# Patient Record
Sex: Male | Born: 1945 | Race: White | Hispanic: No | Marital: Married | Smoking: Never smoker
Health system: Southern US, Community
[De-identification: ages and names within clinical notes are randomized; demographics above are authoritative.]

## PROBLEM LIST (undated history)

## (undated) DIAGNOSIS — M722 Plantar fascial fibromatosis: Secondary | ICD-10-CM

## (undated) DIAGNOSIS — E790 Hyperuricemia without signs of inflammatory arthritis and tophaceous disease: Secondary | ICD-10-CM

## (undated) DIAGNOSIS — K219 Gastro-esophageal reflux disease without esophagitis: Secondary | ICD-10-CM

## (undated) DIAGNOSIS — Z9289 Personal history of other medical treatment: Secondary | ICD-10-CM

## (undated) DIAGNOSIS — R55 Syncope and collapse: Secondary | ICD-10-CM

## (undated) DIAGNOSIS — M779 Enthesopathy, unspecified: Secondary | ICD-10-CM

## (undated) DIAGNOSIS — M719 Bursopathy, unspecified: Secondary | ICD-10-CM

## (undated) DIAGNOSIS — E785 Hyperlipidemia, unspecified: Secondary | ICD-10-CM

## (undated) DIAGNOSIS — G969 Disorder of central nervous system, unspecified: Secondary | ICD-10-CM

## (undated) DIAGNOSIS — M1990 Unspecified osteoarthritis, unspecified site: Secondary | ICD-10-CM

## (undated) DIAGNOSIS — J329 Chronic sinusitis, unspecified: Secondary | ICD-10-CM

## (undated) DIAGNOSIS — R0789 Other chest pain: Secondary | ICD-10-CM

## (undated) HISTORY — DX: Syncope and collapse: R55

## (undated) HISTORY — PX: HEMORRHOID SURGERY: SHX153

## (undated) HISTORY — DX: Bursopathy, unspecified: M71.9

## (undated) HISTORY — DX: Disorder of central nervous system, unspecified: G96.9

## (undated) HISTORY — DX: Enthesopathy, unspecified: M77.9

## (undated) HISTORY — DX: Other chest pain: R07.89

## (undated) HISTORY — DX: Hyperlipidemia, unspecified: E78.5

## (undated) HISTORY — DX: Personal history of other medical treatment: Z92.89

## (undated) HISTORY — DX: Unspecified osteoarthritis, unspecified site: M19.90

## (undated) HISTORY — PX: FRACTURE SURGERY: SHX138

## (undated) HISTORY — DX: Chronic sinusitis, unspecified: J32.9

## (undated) HISTORY — DX: Hyperuricemia without signs of inflammatory arthritis and tophaceous disease: E79.0

## (undated) HISTORY — DX: Plantar fascial fibromatosis: M72.2

---
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
## 1994-10-03 HISTORY — PX: CRANIOTOMY: SHX93

## 1997-10-03 HISTORY — PX: OTHER SURGICAL HISTORY: SHX169

## 2004-02-02 ENCOUNTER — Other Ambulatory Visit: Payer: Self-pay

## 2004-09-29 ENCOUNTER — Other Ambulatory Visit: Payer: Self-pay

## 2004-10-07 ENCOUNTER — Ambulatory Visit: Payer: Self-pay | Admitting: Surgery

## 2005-10-03 HISTORY — PX: CARDIAC CATHETERIZATION: SHX172

## 2006-03-08 ENCOUNTER — Ambulatory Visit: Payer: Self-pay | Admitting: Cardiology

## 2006-03-08 ENCOUNTER — Observation Stay (HOSPITAL_COMMUNITY): Admission: AD | Admit: 2006-03-08 | Discharge: 2006-03-10 | Payer: Self-pay | Admitting: Cardiology

## 2006-04-15 ENCOUNTER — Other Ambulatory Visit: Payer: Self-pay

## 2006-04-15 ENCOUNTER — Emergency Department: Payer: Self-pay | Admitting: Emergency Medicine

## 2009-02-05 ENCOUNTER — Observation Stay: Payer: Self-pay | Admitting: Specialist

## 2009-02-05 ENCOUNTER — Ambulatory Visit: Payer: Self-pay | Admitting: Cardiology

## 2009-02-10 ENCOUNTER — Ambulatory Visit: Payer: Self-pay | Admitting: Cardiovascular Disease

## 2009-02-19 ENCOUNTER — Ambulatory Visit: Payer: Self-pay | Admitting: Cardiology

## 2009-02-19 ENCOUNTER — Encounter: Payer: Self-pay | Admitting: Cardiology

## 2009-02-24 ENCOUNTER — Ambulatory Visit: Payer: Self-pay | Admitting: Cardiology

## 2009-03-04 LAB — CONVERTED CEMR LAB
ALT: 21 units/L (ref 0–53)
AST: 19 units/L (ref 0–37)
Albumin: 4.3 g/dL (ref 3.5–5.2)
Alkaline Phosphatase: 64 units/L (ref 39–117)
BUN: 17 mg/dL (ref 6–23)
CO2: 22 meq/L (ref 19–32)
Calcium: 9.4 mg/dL (ref 8.4–10.5)
Chloride: 107 meq/L (ref 96–112)
Cholesterol: 156 mg/dL (ref 0–200)
Creatinine, Ser: 1.11 mg/dL (ref 0.40–1.50)
Glucose, Bld: 98 mg/dL (ref 70–99)
HDL: 51 mg/dL (ref >39)
LDL Cholesterol: 86 mg/dL (ref 0–99)
Potassium: 4.1 meq/L (ref 3.5–5.3)
Sodium: 142 meq/L (ref 135–145)
Total Bilirubin: 0.9 mg/dL (ref 0.3–1.2)
Total CHOL/HDL Ratio: 3.1
Total Protein: 6.9 g/dL (ref 6.0–8.3)
Triglycerides: 94 mg/dL (ref <150)
VLDL: 19 mg/dL (ref 0–40)

## 2009-09-04 ENCOUNTER — Ambulatory Visit: Payer: Self-pay | Admitting: Cardiology

## 2009-09-04 DIAGNOSIS — E785 Hyperlipidemia, unspecified: Secondary | ICD-10-CM | POA: Insufficient documentation

## 2009-09-04 DIAGNOSIS — R079 Chest pain, unspecified: Secondary | ICD-10-CM | POA: Insufficient documentation

## 2009-10-22 ENCOUNTER — Ambulatory Visit: Payer: Self-pay | Admitting: Podiatry

## 2010-10-25 ENCOUNTER — Telehealth (INDEPENDENT_AMBULATORY_CARE_PROVIDER_SITE_OTHER): Payer: Self-pay | Admitting: *Deleted

## 2010-10-29 ENCOUNTER — Ambulatory Visit
Admission: RE | Admit: 2010-10-29 | Discharge: 2010-10-29 | Payer: Self-pay | Source: Home / Self Care | Attending: Cardiology | Admitting: Cardiology

## 2010-10-29 ENCOUNTER — Encounter: Payer: Self-pay | Admitting: Cardiology

## 2010-11-04 NOTE — Progress Notes (Signed)
Summary: Called pt  Phone Note Outgoing Call Call back at [facility] Phone [phone]   Call placed by: Harlon Flor,  October 25, 2010 2:18 PM Call placed to: Patient Summary of Call: [facility] TCB to reschedule appt with [facility] from 10/29/10 Initial call taken by: Harlon Flor,  October 25, 2010 2:18 PM

## 2010-11-05 ENCOUNTER — Encounter: Payer: Self-pay | Admitting: Cardiology

## 2010-11-05 ENCOUNTER — Other Ambulatory Visit (INDEPENDENT_AMBULATORY_CARE_PROVIDER_SITE_OTHER): Payer: BC Managed Care – PPO

## 2010-11-05 DIAGNOSIS — E785 Hyperlipidemia, unspecified: Secondary | ICD-10-CM

## 2010-11-09 LAB — CONVERTED CEMR LAB
ALT: 19 units/L (ref 0–53)
Bilirubin, Direct: 0.3 mg/dL (ref 0.0–0.3)
Cholesterol: 153 mg/dL (ref 0–200)
Indirect Bilirubin: 1.7 mg/dL — ABNORMAL HIGH (ref 0.0–0.9)
LDL Cholesterol: 91 mg/dL (ref 0–99)
Total Bilirubin: 2 mg/dL — ABNORMAL HIGH (ref 0.3–1.2)
Triglycerides: 95 mg/dL (ref <150)
VLDL: 19 mg/dL (ref 0–40)

## 2010-11-10 NOTE — Assessment & Plan Note (Signed)
Summary: F1Y/AMD   Visit Type:  Initial Consult Primary Provider:  Bethann Punches, M.D.  CC:  c/o chest pain that comes on frequently at any time of the day.Marland Kitchen  History of Present Illness: 65 yo with history of hyperlipidemia and atypical chest pain presents for followup.  He continues to have occasional episodes of mild left lateral chest pain, never with exertion (tends to be completely random).  It tends to occur several times a week and lasts 2-3 minutes at a time.  He tried omeprazole for a week or two with no change in his symptoms.   Myoview in 5/10 showed no ischemia and a mild fixed inferior defect likely due to diaphragmatic attenuation.  He has been swimming 4-5 times a week and has no exertional dyspnea or chest pain.  He has lost about 20 lbs over the last year with increased exercise.    Labs (5/10): LDL 86, HDL 51, K 4.1, creatinine 7.32  ECG: NSR, normal  Current Medications (verified): 1)  Zetia 10 Mg Tabs (Ezetimibe) .... Take One Tablet By Mouth Daily. 2)  Lipitor 10 Mg Tabs (Atorvastatin Calcium) .... Take One Half Tablet By Mouth Mwf 3)  Niaspan 500 Mg Cr-Tabs (Niacin (Antihyperlipidemic)) .... Take Two Tablets By Mouth At Bedtime 4)  Aspirin 81 Mg Tbec (Aspirin) .... Take One Tablet By Mouth Daily 5)  Fish Oil   Oil (Fish Oil) .... 2000mg  Once Daily  Allergies (verified): 1)  ! Prednisone 2)  ! 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 Married. Never smoked.   Past Medical History: Reviewed history from 09/04/2009 and no changes required. 1. Chest pain: Atypical.  LHC (6/07) with EF 55%, mild luminal irregularities.  Lexiscan thallium (5/10, [facility]): EF 64%, fixed inferior defect with normal wall motion, likely diaphragmatic attenuation. No ischemia.  2.  Hyperlipidemia 3.  CNS AVM s/p surgery in 1996.  4.  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    Family History: Reviewed history from 09/04/2009 and no changes required. Father with MI at 58  Social History: Reviewed history from 09/04/2009 and no changes required. 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 Married. Never smoked.   Vital Signs:  Patient profile:   65 year old male Height:      68 inches Weight:      212 pounds BMI:     32.35 Pulse rate:   78 / minute BP sitting:   126 / 84  (left arm) Cuff size:   regular  Vitals Entered By: Bishop Dublin, CMA (October 29, 2010 9:14 AM)  Physical Exam  General:  Well developed, well nourished, in no acute distress. Neck:  Neck supple, no JVD. No masses, thyromegaly or abnormal cervical nodes. Lungs:  Clear bilaterally to auscultation and percussion. Heart:  Normal rate and regular rhythm. S1 and S2 normal without gallop, murmur, click, rub or other extra sounds. Abdomen:  Bowel sounds positive; abdomen soft and non-tender without masses, organomegaly, or hernias noted. No hepatosplenomegaly. Extremities:  No clubbing or cyanosis. Neurologic:  Alert and oriented x 3. Psych:  Normal affect.   Impression & Recommendations:  Problem # 1:  CHEST PAIN-UNSPECIFIED (ICD-786.50) Patient continues to have unchanged mild episodes of atypical chest pain.  Myoview negative in 5/10, cath with luminal irregularities in 6/07.  I suspect that his chest  pain, which is completely nonexertional, is noncardiac.  PPI did not help it either.  If there is any change in his symptom pattern, would consider coronary CT angiogram versus cath.   Problem # 2:  HYPERLIPIDEMIA-MIXED (ICD-272.4) Need lipids/LFTs.   Patient Instructions: 1)  Your physician recommends that you schedule a follow-up appointment in:  1 year   2)  Your physician recommends that you return for a FASTING lipid profile: (Lipid/LFT)

## 2010-12-23 ENCOUNTER — Ambulatory Visit: Payer: Self-pay | Admitting: Internal Medicine

## 2011-02-18 NOTE — Discharge Summary (Signed)
NAME:  Joel Galvan, Joel Galvan NO.:  [id number]   MEDICAL RECORD NO.:  [id number]          PATIENT TYPE:  INP   LOCATION:  2039                         FACILITY:  [facility]   PHYSICIAN:  Joel Galvan, M.D.   DATE OF BIRTH:  1946/05/05   DATE OF ADMISSION:  03/08/2006  DATE OF DISCHARGE:  03/10/2006                                 DISCHARGE SUMMARY   REASON FOR ADMISSION:  Chest discomfort.   DISCHARGE DIAGNOSES:  1.  Non-cardiac chest pain.  2.  Normal coronary arteries by cardiac catheterization this admission.  3.  Good left ventricular function.  4.  Treated dyslipidemia.  5.  Family history of coronary artery disease.  6.  History of left brain arteriovenous malformation in 1996.   PROCEDURE:  Cardiac catheterization by Dr. Jonelle Sidle.   HISTORY OF PRESENT ILLNESS:  Joel Galvan is a 64 year old male patient who  was seen in the office on March 08, 2006, with complaints of chest pain.  He  awoke on the morning of admission with this.  It was decided to admit him  for further evaluation.   HOSPITAL COURSE:  The patient was admitted for further evaluation.  He ruled  out for a myocardial infarction by enzymes.  He was taken for a cardiac  catheterization on March 09, 2006.  This showed normal coronary arteries.  His  ejection fraction was normal at 55%.  It was felt that the patient had non-  cardiac chest pain and should continue risk factor modification.  He was  seen by Dr. Maisie Fus C. Galvan on March 10, 2006, and was felt ready for discharge  to home.   DISCHARGE LABORATORY DATA:  White count 6000, hemoglobin 15.1, hematocrit  43, platelet count 214,000.  INR on admission 1.  Sodium 140, potassium 4.3,  BUN 14, creatinine 1, glucose 103, calcium 9.1.  Liver function tests okay.  Total protein 6.8, albumin 4.1.  Cardiac enzymes negative as noted above.  Total cholesterol 139, triglycerides 80, HDL 44, LDL 79.  TSH 1.070.   A chest x-ray on admission revealed no  active cardiopulmonary disease,  chronic obstructive pulmonary disease.   DISCHARGE MEDICATIONS:  1.  The same as his admitting medications and include Zetia 10 mg daily.  2.  Lipitor 5 mg, three times a week.  3.  Niaspan 1 gram daily.  4.  Multivitamins.  5.  Celebrex p.r.n.  6.  Aspirin daily.   DIET:  A low-fat, low-sodium, low-cholesterol diet.   ACTIVITY:  The patient should refrain from driving for the next three days.  No heavy lifting or sexual activity for the next three days.  He is to  increase his activity slowly and may shower, as well as walk up steps.   WOUND CARE:  The patient is to call for any groin swelling, bleeding or  bruising or fever.   FOLLOWUP:  1.  He can follow up with his primary care physician, Dr. Micah Galvan, as      directed.  2.  He can follow up with cardiology on a p.r.n. basis.  Tereso Galvan, P.A.      Thomas C. Galvan, M.D.  Electronically Signed    SW/MEDQ  D:  03/10/2006  T:  03/10/2006  Job:  [id number]   cc:   Dr. Eligah East  [facility]   Dr. [facility], [place], [place]

## 2011-02-18 NOTE — H&P (Signed)
NAME:  Joel Galvan, DORRIS NO.:  [id number]   MEDICAL RECORD NO.:  [id number]          PATIENT TYPE:  INP   LOCATION:  2039                         FACILITY:  [facility]   PHYSICIAN:  Jesse Sans. Wall, M.D.   DATE OF BIRTH:  [birth year]   DATE OF ADMISSION:  03/08/2006  DATE OF DISCHARGE:                                HISTORY & PHYSICAL   CHIEF COMPLAINT:  Awoke out of sleep this morning with a gripping sensation  in my left chest.   HISTORY OF PRESENT ILLNESS:  Mr. 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 Malcolm Metro, a friend of mine and patient of mine, for the above  complaint.   He has had some atypical chest pain since December.  This morning, he awoke  with this gripping sensation over his left chest and left breast.  He had no  other symptoms.  He was quite concerned and went to see Mr. Denyse Amass, a friend.   Mr. Denyse Amass called me, and we worked him into the office today.  His risk  factors are a family history with his father having a massive MI at age 63  and then a subsequent MI at age 52, at which time he died.  He has a history  of mixed hyperlipidemia.  He is being treated with triple drug therapy by  Dr. Eligah East at [facility].  I do not have these medical records today.   PAST MEDICAL HISTORY:  He has had an AVM that leaked into his left brain in  1996.  This was repaired by Dr. Wyline Mood at [facility].  He was  subsequently on Coumadin without any bleeding problems.   He is intolerant of ROBAXIN and PREDNISONE.  He has had no dire reaction in  the past.   He does not smoke.  Drinks on a social basis.  Uses no other substances.  He  exercises on a regular basis but has not in the past four months because of  some plantar fascitis in his foot.  He has had a right hip replacement with  what sounds like avascular necrosis from prednisone.   CURRENT MEDICATIONS:  1.  Zetia 10  mg a day.  2.  Lipitor 5 mg 3 times a week.  3.  Niaspan 1000 mg a day.  4.  Multivitamins.  5.  Celebrex p.r.n.   FAMILY HISTORY:  Remarkable for the above, otherwise negative.   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  He is married and has two children.   REVIEW OF SYSTEMS:  Remarkable for allergies and hay fever.  Occasional  constipation and fatigue.  Some gastroesophageal reflux, which is rare.  Urinary frequency and plantar fascitis of his left big toe and also has had  some problems with his left thumb.   PHYSICAL EXAMINATION:  VITAL SIGNS:  Blood pressure 110/82.  Pulse 74 and  regular.  Weight 213.  He is 5 foot 8.  GENERAL:  He is in no acute distress.  HEENT:  Normocephalic and atraumatic.  PERRLA.  Extraocular movements  intact.  Sclerae are clear.  Dentition is satisfactory.  NECK:  Carotid upstrokes are equal bilaterally without bruits.  No JVD.  Thyroid is not enlarged.  Trachea is midline.  LUNGS:  Clear.  HEART:  Regular rate and rhythm with nondisplaced PMI.  ABDOMEN:  Soft with good bowel sounds.  No midline bruit.  There is no  hepatomegaly.  There are good bowel sounds.  EXTREMITIES:  No clubbing, cyanosis or edema.  Pulses are brisk.  NEUROLOGIC:  Intact.   Electrocardiogram shows sinus rhythm with RSR prime in lead II, III, and aVF  with early R wave progression in V2.  There are no ST segment changes, per  say.   ASSESSMENT:  1.  Unstable angina until proven otherwise.  2.  Family history of coronary disease.  3.  Mixed hyperlipidemia.  4.  History of an arteriovenous malformation of the left brain, status post      repair in 1996.  5.  History of Coumadin post hip replacement in 1998 without bleed.   PLAN:  1.  Admit to 2000 telemetry.  Patient has a bed.  2.  Check cardiac enzymes x1.  3.  Heparin, per pharmacy.  4.  Aspirin 325 p.o. now.  5.  Lopressor 25 mg now, then 25 mg p.o. q.8h.  6.  Cardiac  catheterization tomorrow.  He is scheduled for 1:30 with Dr. Simona Huh.  If he needs intervention, Dr. Riley Kill is available.  Patient      understands the indications, risks, and potential benefits.  He agrees      to proceed.      Thomas C. Wall, M.D.  Electronically Signed     TCW/MEDQ  D:  03/08/2006  T:  03/08/2006  Job:  [id number]   cc:   Dr. Lorin Picket

## 2011-02-18 NOTE — Cardiovascular Report (Signed)
NAME:  Joel Galvan, Joel Galvan NO.:  [id number]   MEDICAL RECORD NO.:  [id number]          PATIENT TYPE:  INP   LOCATION:  2039                         FACILITY:  [facility]   PHYSICIAN:  Jonelle Sidle, M.D. LHCDATE OF BIRTH:  06/30/1946   DATE OF PROCEDURE:  03/09/2006  DATE OF DISCHARGE:                              CARDIAC CATHETERIZATION   REQUESTING PHYSICIAN:  Jesse Sans. Wall, M.D.   INDICATIONS:  Mr. Siska is a 65 year old male with a history of mixed  hyperlipidemia, previous arteriovenous malformation affecting the left brain  status post surgical repair in 1996, a family history of cardiovascular  disease, and status post previous right hip replacement in 1998.  He was  recently evaluated by Dr. Daleen Squibb with chest discomfort, somewhat atypical,  over the last few months but described as a squeezing sensation that awoke  him from sleep most recently.  He was admitted to the hospital for  evaluation of possible unstable angina symptoms and has ruled out for  myocardial infarction.  He has remained pain-free today.  He is referred for  diagnostic coronary angiography after reviewing the potential risks and  benefits.  He is in agreement to proceed and has signed informed consent.   PROCEDURES PERFORMED:  1.  Left heart catheterization.  2.  Selective coronary angiography.  3.  Left ventriculography.   ACCESS AND EQUIPMENT:  The area about the right femoral artery was  anesthetized with 1% lidocaine and a 6-French sheath was placed in the right  femoral artery via the modified Seldinger technique following a single  anterior wall puncture.  Six-French JL-4 and JR-4 catheters were used for  selective coronary angiography and an angled pigtail catheter was used for  left heart catheterization and left ventriculography.  All exchanges were  made over a wire.  The patient tolerated the procedure well without  immediate complications.  A total of 80 mL Omnipaque were  used.   HEMODYNAMIC RESULTS:  Aorta 114/72 mmHg.  Left ventricle 115/12 mmHg.   ANGIOGRAPHIC FINDINGS:  1.  The left main coronary artery gives rise to the left anterior descending      and circumflex coronary arteries.  There is no significant flow-limiting      coronary atherosclerosis noted.  2.  The left anterior descending is a small to medium-caliber vessel.  There      is a septal perforator near the midvessel segment and more proximally a      large bifurcating diagonal.  There are minor luminal irregularities but      no significant obstructive coronary disease is noted.  3.  The circumflex coronary artery is a medium-caliber vessel essentially      with one obtuse marginal branch and a large atrial branch.  Minor      luminal irregularities are noted without any significant obstructive      coronary disease.  4.  The right coronary artery is a large, dominant vessel with large      posterior descending branch and posterolateral system.  There are small      right ventricular marginal branches noted.  Minor luminal  irregularities      are seen but there is no significant obstructive coronary disease noted.   Left ventriculography was performed in the RAO projection and reveals an  ejection fraction approximately 55% with no focal anterior or inferior wall  motion abnormality and no significant mitral regurgitation.   DIAGNOSES:  1.  No significant obstructive coronary artery disease within the major      epicardial vessels.  Only minor luminal irregularities are noted.  2.  Left ventricular ejection fraction of approximately 55% with no      significant anterior or inferior wall motion abnormality, no mitral      regurgitation, and a left ventricular end-diastolic pressure of 12 mmHg.   DISCUSSION:  I reviewed the results in detail with the patient also  discussed this with Dr. Daleen Squibb by phone.  I would anticipate at this point for  aggressive risk factor modification from  the perspective of the patient's  coronary status.      Jonelle Sidle, M.D. [facility]  Electronically Signed     SGM/MEDQ  D:  03/09/2006  T:  03/10/2006  Job:  [id number]   cc:   Thomas C. Wall, M.D.  [address]  [address]  [place]  [place] [address]

## 2011-03-21 ENCOUNTER — Encounter: Payer: Self-pay | Admitting: Cardiology

## 2012-10-03 ENCOUNTER — Emergency Department: Payer: Self-pay | Admitting: Emergency Medicine

## 2012-10-03 LAB — CBC
HGB: 14.7 g/dL (ref 13.0–18.0)
MCHC: 34.7 g/dL (ref 32.0–36.0)
RBC: 4.76 10*6/uL (ref 4.40–5.90)
RDW: 12.7 % (ref 11.5–14.5)

## 2012-10-03 LAB — COMPREHENSIVE METABOLIC PANEL
Chloride: 109 mmol/L — ABNORMAL HIGH (ref 98–107)
EGFR (African American): >60
SGOT(AST): 32 U/L (ref 15–37)
SGPT (ALT): 38 U/L (ref 12–78)
Sodium: 141 mmol/L (ref 136–145)

## 2012-10-03 LAB — CK TOTAL AND CKMB (NOT AT ARMC)
CK, Total: 351 U/L — ABNORMAL HIGH (ref 35–232)
CK-MB: 5.1 ng/mL — ABNORMAL HIGH (ref 0.5–3.6)

## 2012-10-04 ENCOUNTER — Ambulatory Visit (INDEPENDENT_AMBULATORY_CARE_PROVIDER_SITE_OTHER): Payer: BC Managed Care – PPO | Admitting: Cardiovascular Disease

## 2012-10-04 ENCOUNTER — Encounter: Payer: Self-pay | Admitting: Cardiovascular Disease

## 2012-10-04 VITALS — BP 118/84 | HR 67 | Ht 68.0 in | Wt 193.0 lb

## 2012-10-04 VITALS — BP 118/84 | HR 67 | Ht 68.0 in | Wt 193.5 lb

## 2012-10-04 DIAGNOSIS — R079 Chest pain, unspecified: Secondary | ICD-10-CM

## 2012-10-04 DIAGNOSIS — R0609 Other forms of dyspnea: Secondary | ICD-10-CM

## 2012-10-04 DIAGNOSIS — R0600 Dyspnea, unspecified: Secondary | ICD-10-CM

## 2012-10-04 MED ORDER — PANTOPRAZOLE SODIUM 40 MG PO TBEC: 40.0000 mg | DELAYED_RELEASE_TABLET | Freq: Two times a day (BID) | ORAL | Status: DC

## 2012-10-04 NOTE — Patient Instructions (Addendum)
Your physician has requested that you have an exercise tolerance test. For further information please visit [email or web address]. Please also follow instruction sheet, as given.   

## 2012-10-04 NOTE — Procedures (Signed)
    Treadmill Stress test  Indication: Atypical chest pain.  Baseline Data:  Resting EKG shows NSR with rate of 74 bpm, no significant ST changes. Resting blood pressure of 118/84 mm Hg Stand bruce protocal was used.  Exercise Data:  Patient exercised for 9 min 30 sec,  Peak heart rate of 135 bpm.  This was 87 % of the maximum predicted heart rate. No symptoms of chest pain or lightheadedness were reported at peak stress or in recovery.  Peak Blood pressure recorded was 158/80 Maximal work level: 10.1 METs.  Heart rate at 3 minutes in recovery was 76 bpm. BP response: Normal HR response: Normal  EKG with Exercise: Sinus tachycardia with no significant ST or T wave changes.  FINAL IMPRESSION: Normal exercise stress test. No significant EKG changes concerning for ischemia. Excellent exercise tolerance.  Recommendation: His chest pain is likely noncardiac.

## 2012-10-04 NOTE — Assessment & Plan Note (Signed)
His chest pain is overall atypical. His physical exam is unremarkable and baseline ECG is normal. Due to his risk factors for coronary artery disease, I decided to proceed with a treadmill stress test which showed no evidence of ischemia with excellent exercise capacity. He was able to exercise for 9-1/2 minutes with no symptoms of chest pain and no ECG changes. He had previous cardiac workup including cardiac catheterization in 2007 and a nuclear stress test in 2010. Based on all of the above, the chance of underlying obstructive coronary artery disease is very small. His chest pain does not seem to be cardiac in nature. I suspect possible GERD or esophageal spasm given that many of these episodes are happening at night. I will start him on protonix.  I will also obtain an echocardiogram to make sure he does not have any structural heart abnormalities.  If his symptoms persist in spite of treatment, he might need GI evaluation or other workup. He will be following up with his primary care physician Dr. Hyacinth Meeker as well.

## 2012-10-04 NOTE — Patient Instructions (Addendum)
Your stress is normal.  Start Protonix 40 mg twice daily.   Your physician has requested that you have an echocardiogram. Echocardiography is a painless test that uses sound waves to create images of your heart. It provides your doctor with information about the size and shape of your heart and how well your heart's chambers and valves are working. This procedure takes approximately one hour. There are no restrictions for this procedure.  Follow up after echo.

## 2012-10-04 NOTE — Progress Notes (Signed)
HPI  This is a pleasant 67 year old male who is here today for evaluation of chest pain. He has been seen by [place] in the past for similar complaints. He presented in 2007 with chest pain worrisome for possible unstable angina. He was admitted to [facility] and rule out for myocardial infarction. He underwent cardiac catheterization which showed minor luminal irregularities without evidence of obstructive disease. He was seen by Dr. Shirlee Latch in 2010 for atypical chest pain. He underwent a nuclear stress test which showed no evidence of ischemia with normal ejection fraction. He has known history of hyperlipidemia and family history of premature coronary artery disease. He works as a Education administrator.  He describes substernal aching sensation mostly at rest and not with physical activities. Actually he is able to exercise on a regular basis without any exertional chest pain or dyspnea. He swims 3 or 4 times a week. The chest pain woke him up from sleep on multiple occasions. He had a severe episode yesterday and thus he was taken to the emergency room at [facility]. His ECG showed no acute changes. Labs were overall unremarkable with a normal troponin. CPK was mildly elevated. His chest pain is not reproducible by touch and does not worsen with physical activities.  Allergies  Allergen Reactions  . Methocarbamol   . Prednisone      Current Outpatient Prescriptions on File Prior to Visit  Medication Sig Dispense Refill  . aspirin (ASPIR-81) 81 MG EC tablet Take 81 mg by mouth daily.        Marland Kitchen ezetimibe (ZETIA) 10 MG tablet Take 10 mg by mouth daily.        . niacin (NIASPAN) 500 MG CR tablet Take 500 mg by mouth at bedtime. Take 2 tabs       . ranitidine (ZANTAC) 75 MG tablet Take 75 mg by mouth 2 (two) times daily.      . pantoprazole (PROTONIX) 40 MG tablet Take 1 tablet (40 mg total) by mouth 2 (two) times daily.  60 tablet  2     Past Medical History  Diagnosis Date  . Chest pain, atypical     LHC  (6/07) w EF 55%, mild luminal irregularities. Lexiscan thallium (5/10, [facility]): EF 64%, fixed inferior defect w normal wall motion, likely diaphragmatic attenuation. No ischemia  . Hyperlipidemia   . CNS (central nervous system disease)     AVM s/p surgery in 1996  . Syncope and collapse     history  . Plantar fasciitis   . Osteoarthritis     hands and feet  . Hyperuricemia      Past Surgical History  Procedure Date  . 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 Cardiac catheterization 2007    cone  . Hemorrhoid surgery   . Craniotomy   . Appendectomy      Family History  Problem Relation Age of Onset  . Heart attack Father 39     History   Social History  . Marital Status: Single    Spouse Name: N/A    Number of Children: N/A  . Years of Education: N/A   Occupational History  . Not on file.   Social History Main Topics  . Smoking status: Never Smoker   . Smokeless tobacco: Not on file  . Alcohol Use: Yes     Comment: MODERATE  . Drug Use: No  . Sexually Active:    Other Topics  Concern  . 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      ROS Constitutional: Negative for fever, chills, diaphoresis, activity change, appetite change and fatigue.  HENT: Negative for hearing loss, nosebleeds, congestion, sore throat, facial swelling, drooling, trouble swallowing, neck pain, voice change, sinus pressure and tinnitus.  Eyes: Negative for photophobia, pain, discharge and visual disturbance.  Respiratory: Negative for apnea, cough, shortness of breath and wheezing.  Cardiovascular: Negative for palpitations and leg swelling.  Gastrointestinal: Negative for nausea, vomiting, abdominal pain, diarrhea, constipation, blood in stool and abdominal distention.  Genitourinary: Negative for dysuria, urgency, frequency, hematuria and decreased urine volume.  Musculoskeletal: Negative for myalgias,  back pain, joint swelling, arthralgias and gait problem.  Skin: Negative for color change, pallor, rash and wound.  Neurological: Negative for dizziness, tremors, seizures, syncope, speech difficulty, weakness, light-headedness, numbness and headaches.  Psychiatric/Behavioral: Negative for suicidal ideas, hallucinations, behavioral problems and agitation. The patient is not nervous/anxious.     PHYSICAL EXAM   BP 118/84  Pulse 67  Ht 5\' 8"  (1.727 m)  Wt 193 lb 8 oz (87.771 kg)  BMI 29.42 kg/m2 Constitutional: He is oriented to person, place, and time. He appears well-developed and well-nourished. No distress.  HENT: No nasal discharge.  Head: Normocephalic and atraumatic.  Eyes: Pupils are equal and round. Right eye exhibits no discharge. Left eye exhibits no discharge.  Neck: Normal range of motion. Neck supple. No JVD present. No thyromegaly present.  Cardiovascular: Normal rate, regular rhythm, normal heart sounds and. Exam reveals no gallop and no friction rub. No murmur heard.  Pulmonary/Chest: Effort normal and breath sounds normal. No stridor. No respiratory distress. He has no wheezes. He has no rales. He exhibits no tenderness.  Abdominal: Soft. Bowel sounds are normal. He exhibits no distension. There is no tenderness. There is no rebound and no guarding.  Musculoskeletal: Normal range of motion. He exhibits no edema and no tenderness.  Neurological: He is alert and oriented to person, place, and time. Coordination normal.  Skin: Skin is warm and dry. No rash noted. He is not diaphoretic. No erythema. No pallor.  Psychiatric: He has a normal mood and affect. His behavior is normal. Judgment and thought content normal.       EKG: Sinus  Rhythm  WITHIN NORMAL LIMITS   ASSESSMENT AND PLAN

## 2012-10-05 ENCOUNTER — Other Ambulatory Visit: Payer: Self-pay | Admitting: Cardiovascular Disease

## 2012-10-10 ENCOUNTER — Ambulatory Visit: Payer: BC Managed Care – PPO | Admitting: Nurse Practitioner

## 2012-10-23 ENCOUNTER — Other Ambulatory Visit: Payer: Self-pay

## 2012-10-23 ENCOUNTER — Other Ambulatory Visit (INDEPENDENT_AMBULATORY_CARE_PROVIDER_SITE_OTHER): Payer: BC Managed Care – PPO

## 2012-10-23 DIAGNOSIS — R0600 Dyspnea, unspecified: Secondary | ICD-10-CM

## 2012-10-23 DIAGNOSIS — R079 Chest pain, unspecified: Secondary | ICD-10-CM

## 2012-11-02 ENCOUNTER — Encounter: Payer: Self-pay | Admitting: Cardiovascular Disease

## 2012-11-02 ENCOUNTER — Ambulatory Visit (INDEPENDENT_AMBULATORY_CARE_PROVIDER_SITE_OTHER): Payer: BC Managed Care – PPO | Admitting: Cardiovascular Disease

## 2012-11-02 VITALS — BP 102/70 | HR 72 | Ht 68.0 in | Wt 194.5 lb

## 2012-11-02 DIAGNOSIS — R079 Chest pain, unspecified: Secondary | ICD-10-CM

## 2012-11-02 NOTE — Assessment & Plan Note (Signed)
No significant episodes since he was started on Protonix. He had an echocardiogram done which showed normal LV systolic function without significant valvular abnormalities. There was a concern about possible apical thrombus. However, I personally reviewed the images. I do not think there is evidence of apical thrombus. There appears to be a calcified prominent trabeculation. The patient had no prior anterior MI. I do not recommend further cardiac evaluation at this time. I will have him followup with me on a yearly basis given his risk factors and family history.

## 2012-11-02 NOTE — Patient Instructions (Addendum)
Continue same medications  Follow up in 1 year

## 2012-11-02 NOTE — Progress Notes (Signed)
HPI  This is a pleasant 67 year old male who is here today for a followup visit regarding atypical chest pain.  He presented in 2007 with chest pain worrisome for possible unstable angina. He was admitted to [facility] and rule out for myocardial infarction. He underwent cardiac catheterization which showed minor luminal irregularities without evidence of obstructive disease. He was seen by Dr. Shirlee Latch in 2010 for atypical chest pain. He underwent a nuclear stress test which showed no evidence of ischemia with normal ejection fraction. He has known history of hyperlipidemia and family history of premature coronary artery disease.   He was seen recently for atypical chest pain at rest was negative basic workup in the ED. He underwent a treadmill stress test which showed excellent exercise capacity with no evidence of ischemia. His chest pain was felt to be GI in nature. He was started on protonix. He has not had significant episodes since then. He saw Dr. Hyacinth Meeker who referred the patient for EGD and colonoscopy. He continues to exercise regularly with no exertional symptoms.  Allergies  Allergen Reactions  . Methocarbamol   . Prednisone      Current Outpatient Prescriptions on File Prior to Visit  Medication Sig Dispense Refill  . aspirin (ASPIR-81) 81 MG EC tablet Take 81 mg by mouth daily.        Marland Kitchen ezetimibe (ZETIA) 10 MG tablet Take 10 mg by mouth daily.        . Multiple Vitamin (MULTIVITAMIN) tablet Take 1 tablet by mouth daily.      . niacin (NIASPAN) 500 MG CR tablet Take 500 mg by mouth at bedtime. Take 2 tabs       . pantoprazole (PROTONIX) 40 MG tablet Take 1 tablet (40 mg total) by mouth 2 (two) times daily.  60 tablet  2     Past Medical History  Diagnosis Date  . Chest pain, atypical     LHC (6/07) w EF 55%, mild luminal irregularities. Lexiscan thallium (5/10, [facility]): EF 64%, fixed inferior defect w normal wall motion, likely diaphragmatic attenuation. No ischemia  .  Hyperlipidemia   . CNS (central nervous system disease)     AVM s/p surgery in 1996  . Syncope and collapse     history  . Plantar fasciitis   . Osteoarthritis     hands and feet  . Hyperuricemia      Past Surgical History  Procedure Date  . 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 Cardiac catheterization 2007    cone  . Hemorrhoid surgery   . Craniotomy   . Appendectomy      Family History  Problem Relation Age of Onset  . Heart attack Father 37     History   Social History  . Marital Status: Single    Spouse Name: N/A    Number of Children: N/A  . Years of Education: N/A   Occupational History  . Not on file.   Social History Main Topics  . Smoking status: Never Smoker   . Smokeless tobacco: Not on file  . Alcohol Use: Yes     Comment: MODERATE  . Drug Use: No  . Sexually Active:    Other Topics Concern  . 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         PHYSICAL EXAM   BP 102/70  Pulse  72  Ht 5\' 8"  (1.727 m)  Wt 194 lb 8 oz (88.225 kg)  BMI 29.57 kg/m2 Constitutional: He is oriented to person, place, and time. He appears well-developed and well-nourished. No distress.  HENT: No nasal discharge.  Head: Normocephalic and atraumatic.  Eyes: Pupils are equal and round. Right eye exhibits no discharge. Left eye exhibits no discharge.  Neck: Normal range of motion. Neck supple. No JVD present. No thyromegaly present.  Cardiovascular: Normal rate, regular rhythm, normal heart sounds and. Exam reveals no gallop and no friction rub. No murmur heard.  Pulmonary/Chest: Effort normal and breath sounds normal. No stridor. No respiratory distress. He has no wheezes. He has no rales. He exhibits no tenderness.  Abdominal: Soft. Bowel sounds are normal. He exhibits no distension. There is no tenderness. There is no rebound and no guarding.  Musculoskeletal: Normal range of  motion. He exhibits no edema and no tenderness.  Neurological: He is alert and oriented to person, place, and time. Coordination normal.  Skin: Skin is warm and dry. No rash noted. He is not diaphoretic. No erythema. No pallor.  Psychiatric: He has a normal mood and affect. His behavior is normal. Judgment and thought content normal.     ASSESSMENT AND PLAN

## 2013-01-10 ENCOUNTER — Ambulatory Visit: Payer: Self-pay | Admitting: Unknown Physician Specialty

## 2013-10-16 ENCOUNTER — Ambulatory Visit: Payer: BC Managed Care – PPO | Admitting: Cardiovascular Disease

## 2013-11-08 ENCOUNTER — Ambulatory Visit: Payer: 59 | Admitting: Cardiovascular Disease

## 2013-11-26 ENCOUNTER — Ambulatory Visit: Payer: 59 | Admitting: Cardiovascular Disease

## 2013-12-06 ENCOUNTER — Encounter: Payer: Self-pay | Admitting: Cardiovascular Disease

## 2013-12-06 ENCOUNTER — Encounter (INDEPENDENT_AMBULATORY_CARE_PROVIDER_SITE_OTHER): Payer: Self-pay

## 2013-12-06 ENCOUNTER — Ambulatory Visit (INDEPENDENT_AMBULATORY_CARE_PROVIDER_SITE_OTHER): Payer: 59 | Admitting: Cardiovascular Disease

## 2013-12-06 VITALS — BP 120/82 | HR 58 | Ht 68.0 in | Wt 198.8 lb

## 2013-12-06 DIAGNOSIS — R079 Chest pain, unspecified: Secondary | ICD-10-CM

## 2013-12-06 DIAGNOSIS — R002 Palpitations: Secondary | ICD-10-CM

## 2013-12-06 DIAGNOSIS — E785 Hyperlipidemia, unspecified: Secondary | ICD-10-CM

## 2013-12-06 MED ORDER — PANTOPRAZOLE SODIUM 40 MG PO TBEC: 40.0000 mg | DELAYED_RELEASE_TABLET | Freq: Every day | ORAL | Status: DC

## 2013-12-06 NOTE — Assessment & Plan Note (Signed)
I discontinued niacin today. Continue treatment with atorvastatin and ezetimibe. Check fasting lipid and liver profile today.

## 2013-12-06 NOTE — Progress Notes (Signed)
HPI  This is a pleasant 68 year old male who is here today for a followup visit regarding atypical chest pain.  He presented in 2007 with chest pain worrisome for possible unstable angina. He was admitted to [facility] and ruled out for myocardial infarction. He underwent cardiac catheterization which showed minor luminal irregularities without evidence of obstructive disease. He was seen by Dr. Shirlee Latch in 2010 for atypical chest pain. He underwent a nuclear stress test which showed no evidence of ischemia with normal ejection fraction. He has known history of hyperlipidemia and family history of premature coronary artery disease.   He was seen in 2014 for atypical chest pain at rest . He underwent a treadmill stress test which showed excellent exercise capacity with no evidence of ischemia. His chest pain was felt to be GI in nature. He was started on protonix with improvement in symptoms. He has been doing well and denies any new symptoms. He did have right shoulder bursitis which affected his ability to exercise. He usually swims on a regular basis.  Allergies  Allergen Reactions  . Methocarbamol   . Prednisone      Current Outpatient Prescriptions on File Prior to Visit  Medication Sig Dispense Refill  . atorvastatin (LIPITOR) 10 MG tablet Take 5 mg by mouth as directed.       . ezetimibe (ZETIA) 10 MG tablet Take 10 mg by mouth daily.        . Multiple Vitamin (MULTIVITAMIN) tablet Take 1 tablet by mouth daily.      . niacin (NIASPAN) 500 MG CR tablet Take 500 mg by mouth at bedtime. Take 2 tabs       . pantoprazole (PROTONIX) 40 MG tablet Take 1 tablet (40 mg total) by mouth 2 (two) times daily.  60 tablet  2  . aspirin (ASPIR-81) 81 MG EC tablet Take 81 mg by mouth daily.         No current facility-administered medications on file prior to visit.     Past Medical History  Diagnosis Date  . Chest pain, atypical     LHC (6/07) w EF 55%, mild luminal irregularities.  Lexiscan thallium (5/10, [facility]): EF 64%, fixed inferior defect w normal wall motion, likely diaphragmatic attenuation. No ischemia  . Hyperlipidemia   . CNS (central nervous system disease)     AVM s/p surgery in 1996  . Syncope and collapse     history  . Plantar fasciitis   . Osteoarthritis     hands and feet  . Hyperuricemia   . Bursitis      Past Surgical History  Procedure Laterality Date  . 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 Cardiac catheterization  2007    cone  . Hemorrhoid surgery    . Craniotomy    . Appendectomy       Family History  Problem Relation Age of Onset  . Heart attack Father 60     History   Social History  . Marital Status: Single    Spouse Name: N/A    Number of Children: N/A  . Years of Education: N/A   Occupational History  . Not on file.   Social History Main Topics  . Smoking status: Never Smoker   . Smokeless tobacco: Not on file  . Alcohol Use: Yes     Comment: MODERATE  . Drug Use: No  . Sexual Activity: Not on file  Other Topics Concern  . 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         PHYSICAL EXAM   BP 120/82  Pulse 58  Ht 5\' 8"  (1.727 m)  Wt 198 lb 12 oz (90.152 kg)  BMI 30.23 kg/m2 Constitutional: He is oriented to person, place, and time. He appears well-developed and well-nourished. No distress.  HENT: No nasal discharge.  Head: Normocephalic and atraumatic.  Eyes: Pupils are equal and round. Right eye exhibits no discharge. Left eye exhibits no discharge.  Neck: Normal range of motion. Neck supple. No JVD present. No thyromegaly present.  Cardiovascular: Normal rate, regular rhythm, normal heart sounds and. Exam reveals no gallop and no friction rub. No murmur heard.  Pulmonary/Chest: Effort normal and breath sounds normal. No stridor. No respiratory distress. He has no wheezes. He has no rales. He exhibits no  tenderness.  Abdominal: Soft. Bowel sounds are normal. He exhibits no distension. There is no tenderness. There is no rebound and no guarding.  Musculoskeletal: Normal range of motion. He exhibits no edema and no tenderness.  Neurological: He is alert and oriented to person, place, and time. Coordination normal.  Skin: Skin is warm and dry. No rash noted. He is not diaphoretic. No erythema. No pallor.  Psychiatric: He has a normal mood and affect. His behavior is normal. Judgment and thought content normal.   EKG: Normal sinus rhythm with no significant ST changes.  ASSESSMENT AND PLAN

## 2013-12-06 NOTE — Patient Instructions (Signed)
Return for fasting labs at your convenience  Stop taking Niaspan.   Continue other medications.   Your physician wants you to follow-up in: 12 months.  You will receive a reminder letter in the mail two months in advance. If you don't receive a letter, please call our office to schedule the follow-up appointment.

## 2013-12-06 NOTE — Assessment & Plan Note (Signed)
No new episodes of chest discomfort. Negative cardiac workup in the past. Continue observation and treatment of risk factors given family history of coronary artery disease.

## 2013-12-16 ENCOUNTER — Ambulatory Visit (INDEPENDENT_AMBULATORY_CARE_PROVIDER_SITE_OTHER): Payer: 59

## 2013-12-16 DIAGNOSIS — E785 Hyperlipidemia, unspecified: Secondary | ICD-10-CM

## 2013-12-17 LAB — HEPATIC FUNCTION PANEL
ALK PHOS: 60 IU/L (ref 39–117)
ALT: 33 IU/L (ref 0–44)
AST: 34 IU/L (ref 0–40)
Albumin: 4.4 g/dL (ref 3.6–4.8)
Bilirubin, Direct: 0.24 mg/dL (ref 0.00–0.40)
Total Bilirubin: 1 mg/dL (ref 0.0–1.2)
Total Protein: 6.7 g/dL (ref 6.0–8.5)

## 2013-12-17 LAB — LIPID PANEL
CHOL/HDL RATIO: 2.6 ratio (ref 0.0–5.0)
CHOLESTEROL TOTAL: 185 mg/dL (ref 100–199)
HDL: 71 mg/dL (ref >39)
LDL Calculated: 100 mg/dL — ABNORMAL HIGH (ref 0–99)
TRIGLYCERIDES: 70 mg/dL (ref 0–149)
VLDL CHOLESTEROL CAL: 14 mg/dL (ref 5–40)

## 2014-06-25 ENCOUNTER — Ambulatory Visit: Payer: Self-pay | Admitting: Internal Medicine

## 2014-10-08 HISTORY — PX: ROTATOR CUFF REPAIR: SHX139

## 2014-12-09 ENCOUNTER — Encounter: Payer: Self-pay | Admitting: Cardiovascular Disease

## 2014-12-09 ENCOUNTER — Ambulatory Visit (INDEPENDENT_AMBULATORY_CARE_PROVIDER_SITE_OTHER): Payer: Medicare Other | Admitting: Cardiovascular Disease

## 2014-12-09 VITALS — BP 151/97 | HR 75 | Ht 68.0 in | Wt 211.2 lb

## 2014-12-09 DIAGNOSIS — E785 Hyperlipidemia, unspecified: Secondary | ICD-10-CM

## 2014-12-09 DIAGNOSIS — IMO0001 Reserved for inherently not codable concepts without codable children: Secondary | ICD-10-CM | POA: Insufficient documentation

## 2014-12-09 DIAGNOSIS — R079 Chest pain, unspecified: Secondary | ICD-10-CM

## 2014-12-09 DIAGNOSIS — R030 Elevated blood-pressure reading, without diagnosis of hypertension: Secondary | ICD-10-CM

## 2014-12-09 NOTE — Assessment & Plan Note (Signed)
Repeat blood pressure was 130/92 by me in the left arm. He is having some discomfort as a result of shoulder surgery which might be contributing to elevated blood pressure. Also decreased physical activities and weight came might also be contributing. I asked him to monitor blood pressure at home. If multiple readings are above 140/90, and antihypertensive medication such as amlodipine can be considered.

## 2014-12-09 NOTE — Patient Instructions (Signed)
Return for fasting labs.   Monitor blood pressure at home. Call us if readings are above 140/90.   Your physician wants you to follow-up in: 1 year.  You will receive a reminder letter in the mail two months in advance. If you don't receive a letter, please call our office to schedule the follow-up appointment.

## 2014-12-09 NOTE — Progress Notes (Signed)
HPI  This is a pleasant 69 year old male who is here today for a followup visit regarding atypical chest pain.  He presented in 2007 with chest pain worrisome for possible unstable angina. He was [facility] to [facility] and ruled out for myocardial infarction. He underwent cardiac catheterization which showed minor luminal irregularities without evidence of obstructive disease. He was seen by Dr. Shirlee LatchMcLean in 2010 for atypical chest pain. He underwent a nuclear stress test which showed no evidence of ischemia with normal ejection fraction. He has known history of hyperlipidemia and family history of premature coronary artery disease.   He was seen in 2014 for atypical chest pain at rest . He underwent a treadmill stress test which showed excellent exercise capacity with no evidence of ischemia. His chest pain was felt to be GI in nature. He was started on protonix with improvement in symptoms. He was diagnosed with right rotator cuff tear and underwent surgery. This has limited his physical activities and exercise significantly. As a result, he has gained 13 pounds since last visit. He has noticed that his blood pressure has been running somewhat on the high side. He denies headache or dizziness.  Allergies  Allergen Reactions  . Methocarbamol   . Prednisone      Current Outpatient Prescriptions on File Prior to Visit  Medication Sig Dispense Refill  . atorvastatin (LIPITOR) 10 MG tablet Take 5 mg by mouth as directed.     . ezetimibe (ZETIA) 10 MG tablet Take 10 mg by mouth daily.      . pantoprazole (PROTONIX) 40 MG tablet Take 1 tablet (40 mg total) by mouth daily. 30 tablet 11  . aspirin (ASPIR-81) 81 MG EC tablet Take 81 mg by mouth daily.      . Multiple Vitamin (MULTIVITAMIN) tablet Take 1 tablet by mouth daily.     No current facility-administered medications on file prior to visit.     Past Medical History  Diagnosis Date  . Chest pain, atypical     LHC (6/07) w EF  55%, mild luminal irregularities. Lexiscan thallium (5/10, [facility]): EF 64%, fixed inferior defect w normal wall motion, likely diaphragmatic attenuation. No ischemia  . Hyperlipidemia   . CNS (central nervous system disease)     AVM s/p surgery in 1996  . Syncope and collapse     history  . Plantar fasciitis   . Osteoarthritis     hands and feet  . Hyperuricemia   . Bursitis   . Tendonitis      Past Surgical History  Procedure Laterality Date  . 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 Cardiac catheterization  2007    cone  . Hemorrhoid surgery    . Craniotomy    . Appendectomy    . Rotator cuff repair Right      Family History  Problem Relation Age of Onset  . Heart attack Father [id number]     History   Social History  . Marital Status: Married    Spouse Name: N/A  . Number of Children: N/A  . Years of Education: N/A   Occupational History  . Not on file.   Social History Main Topics  . Smoking status: Never Smoker   . Smokeless tobacco: Not on file  . Alcohol Use: Yes     Comment: MODERATE  . Drug Use: No  . Sexual Activity: Not on file   Other Topics  Concern  . 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         PHYSICAL EXAM   BP 151/97 mmHg  Pulse 75  Ht  (1.727 m)  Wt 211 lb 4 oz (95.822 kg)  BMI 32.13 kg/m2 Constitutional: He is oriented to person, place, and time. He appears well-developed and well-nourished. No distress.  HENT: No nasal discharge.  Head: Normocephalic and atraumatic.  Eyes: Pupils are equal and round. Right eye exhibits no discharge. Left eye exhibits no discharge.  Neck: Normal range of motion. Neck supple. No JVD present. No thyromegaly present.  Cardiovascular: Normal rate, regular rhythm, normal heart sounds and. Exam reveals no gallop and no friction rub. No murmur heard.  Pulmonary/Chest: Effort normal and breath sounds normal. No  stridor. No respiratory distress. He has no wheezes. He has no rales. He exhibits no tenderness.  Abdominal: Soft. Bowel sounds are normal. He exhibits no distension. There is no tenderness. There is no rebound and no guarding.  Musculoskeletal: Normal range of motion. He exhibits no edema and no tenderness.  Neurological: He is alert and oriented to person, place, and time. Coordination normal.  Skin: Skin is warm and dry. No rash noted. He is not diaphoretic. No erythema. No pallor.  Psychiatric: He has a normal mood and affect. His behavior is normal. Judgment and thought content normal.   EKG: Sinus  Rhythm  -RSR(V1) -nondiagnostic.   PROBABLY NORMAL  ASSESSMENT AND PLAN

## 2014-12-09 NOTE — Assessment & Plan Note (Signed)
Continue treatment with atorvastatin and that area. I requested fasting lipid and liver profile.

## 2014-12-11 ENCOUNTER — Other Ambulatory Visit (INDEPENDENT_AMBULATORY_CARE_PROVIDER_SITE_OTHER): Payer: Medicare Other | Admitting: *Deleted

## 2014-12-11 DIAGNOSIS — E785 Hyperlipidemia, unspecified: Secondary | ICD-10-CM

## 2014-12-12 LAB — LIPID PANEL
CHOL/HDL RATIO: 3.1 ratio (ref 0.0–5.0)
Cholesterol, Total: 160 mg/dL (ref 100–199)
HDL: 52 mg/dL (ref >39)
LDL CALC: 90 mg/dL (ref 0–99)
TRIGLYCERIDES: 88 mg/dL (ref 0–149)
VLDL Cholesterol Cal: 18 mg/dL (ref 5–40)

## 2014-12-12 LAB — HEPATIC FUNCTION PANEL
ALBUMIN: 4.5 g/dL (ref 3.6–4.8)
ALK PHOS: 61 IU/L (ref 39–117)
ALT: 35 IU/L (ref 0–44)
AST: 25 IU/L (ref 0–40)
BILIRUBIN TOTAL: 1.1 mg/dL (ref 0.0–1.2)
BILIRUBIN, DIRECT: 0.22 mg/dL (ref 0.00–0.40)
TOTAL PROTEIN: 6.2 g/dL (ref 6.0–8.5)

## 2015-01-19 ENCOUNTER — Emergency Department
Admit: 2015-01-19 | Disposition: A | Discharge status: Home / Self Care | Payer: Self-pay | Admitting: Emergency Medicine

## 2015-01-19 LAB — TROPONIN I: Troponin-I: <0.03 ng/mL

## 2015-01-19 LAB — BASIC METABOLIC PANEL
Anion Gap: 7 (ref 7–16)
BUN: 16 mg/dL
CALCIUM: 9.4 mg/dL
CHLORIDE: 105 mmol/L
CREATININE: 1.13 mg/dL
Co2: 26 mmol/L
EGFR (African American): >60
EGFR (Non-African Amer.): >60
Glucose: 99 mg/dL
Potassium: 4.5 mmol/L
Sodium: 138 mmol/L

## 2015-01-19 LAB — CBC
HCT: 48.1 % (ref 40.0–52.0)
HGB: 16 g/dL (ref 13.0–18.0)
MCH: 29.6 pg (ref 26.0–34.0)
MCHC: 33.3 g/dL (ref 32.0–36.0)
MCV: 89 fL (ref 80–100)
Platelet: 211 10*3/uL (ref 150–440)
RBC: 5.4 10*6/uL (ref 4.40–5.90)
RDW: 13.2 % (ref 11.5–14.5)
WBC: 7.2 10*3/uL (ref 3.8–10.6)

## 2015-01-21 NOTE — Telephone Encounter (Signed)
This encounter was created in error - please disregard.

## 2015-01-26 ENCOUNTER — Ambulatory Visit (INDEPENDENT_AMBULATORY_CARE_PROVIDER_SITE_OTHER): Payer: Medicare Other | Admitting: Cardiovascular Disease

## 2015-01-26 ENCOUNTER — Encounter: Payer: Self-pay | Admitting: Cardiovascular Disease

## 2015-01-26 VITALS — BP 102/80 | HR 67 | Ht 68.0 in | Wt 207.5 lb

## 2015-01-26 DIAGNOSIS — E785 Hyperlipidemia, unspecified: Secondary | ICD-10-CM

## 2015-01-26 DIAGNOSIS — R079 Chest pain, unspecified: Secondary | ICD-10-CM

## 2015-01-26 DIAGNOSIS — R0789 Other chest pain: Secondary | ICD-10-CM

## 2015-01-26 NOTE — Assessment & Plan Note (Signed)
His chest pain overall was atypical. Cardiac exam is unremarkable and baseline ECG is normal. Given his risk factors for coronary artery disease, I requested a treadmill stress test.

## 2015-01-26 NOTE — Patient Instructions (Signed)
Medication Instructions:  None  Labwork: None  Testing/Procedures: Your physician has requested that you have an exercise tolerance test. For further information please visit [email or web address] Please also follow instruction sheet, as given.    Follow-Up: Your physician wants you to follow-up in: 1 year with Dr. Kirke CorinArida. You will receive a reminder letter in the mail two months in advance. If you don't receive a letter, please call our office to schedule the follow-up appointment.   Any Other Special Instructions Will Be Listed Below (If Applicable).

## 2015-01-26 NOTE — Assessment & Plan Note (Signed)
Lab Results  Component Value Date   CHOL 160 12/11/2014   HDL 52 12/11/2014   LDLCALC 90 12/11/2014   TRIG 88 12/11/2014   CHOLHDL 3.1 12/11/2014   Lipid profile was reasonable on Lipitor and Zetia. LDL was less than 100.

## 2015-01-26 NOTE — Progress Notes (Signed)
HPI  This is a pleasant 69 year old male who is here today for a followup visit regarding atypical chest pain.  He presented in 2007 with chest pain worrisome for possible unstable angina. He was admitted to [facility] and ruled out for myocardial infarction. He underwent cardiac catheterization which showed minor luminal irregularities without evidence of obstructive disease. He was seen by Dr. Shirlee Latch in 2010 for atypical chest pain. He underwent a nuclear stress test which showed no evidence of ischemia with normal ejection fraction. He has known history of hyperlipidemia and family history of premature coronary artery disease.   He was seen in 2014 for atypical chest pain at rest . He underwent a treadmill stress test which showed excellent exercise capacity with no evidence of ischemia. His chest pain was felt to be GI in nature. He was started on protonix with improvement in symptoms. He was diagnosed with right rotator cuff tear and underwent surgery. This has limited his physical activities and exercise significantly.   He was getting physical therapy on his shoulder on Monday and after the session he had sharp left-sided chest pain lasting for a few minutes with weakness. The episodes happen at rest with no radiation. There was mild shortness of breath. It resolved without intervention. He has noticed increased heartburn lately and has been taking Protonix. He started using an antacid as well. She went to the emergency room at [facility]. Chest x-ray and labs were unremarkable. EKG showed no acute changes. He reports no further episodes.  Allergies  Allergen Reactions  . Methocarbamol   . Prednisone      Current Outpatient Prescriptions on File Prior to Visit  Medication Sig Dispense Refill  . aspirin (ASPIR-81) 81 MG EC tablet Take 81 mg by mouth daily.      Marland Kitchen atorvastatin (LIPITOR) 10 MG tablet Take 5 mg by mouth as directed.     . ezetimibe (ZETIA) 10 MG tablet Take 10 mg by  mouth daily.      . Multiple Vitamin (MULTIVITAMIN) tablet Take 1 tablet by mouth daily.    . naproxen sodium (ANAPROX) 220 MG tablet Take 220 mg by mouth as needed.    . pantoprazole (PROTONIX) 40 MG tablet Take 1 tablet (40 mg total) by mouth daily. 30 tablet 11   No current facility-administered medications on file prior to visit.     Past Medical History  Diagnosis Date  . Chest pain, atypical     LHC (6/07) w EF 55%, mild luminal irregularities. Lexiscan thallium (5/10, [facility]): EF 64%, fixed inferior defect w normal wall motion, likely diaphragmatic attenuation. No ischemia  . Hyperlipidemia   . CNS (central nervous system disease)     AVM s/p surgery in 1996  . Syncope and collapse     history  . Plantar fasciitis   . Osteoarthritis     hands and feet  . Hyperuricemia   . Bursitis   . Tendonitis      Past Surgical History  Procedure Laterality Date  . 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 Cardiac catheterization  2007    cone  . Hemorrhoid surgery    . Craniotomy    . Appendectomy    . Rotator cuff repair Right      Family History  Problem Relation Age of Onset  . Heart attack Father 23     History   Social History  . Marital Status:  Married    Spouse Name: N/A  . Number of Children: N/A  . Years of Education: N/A   Occupational History  . Not on file.   Social History Main Topics  . Smoking status: Never Smoker   . Smokeless tobacco: Not on file  . Alcohol Use: Yes     Comment: MODERATE  . Drug Use: No  . Sexual Activity: Not on file   Other Topics Concern  . 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         PHYSICAL EXAM   BP 102/80 mmHg  Pulse 67  Ht 5\' 8"  (1.727 m)  Wt 207 lb 8 oz (94.121 kg)  BMI 31.56 kg/m2 Constitutional: He is oriented to person, place, and time. He appears well-developed and well-nourished. No distress.  HENT: No  nasal discharge.  Head: Normocephalic and atraumatic.  Eyes: Pupils are equal and round. Right eye exhibits no discharge. Left eye exhibits no discharge.  Neck: Normal range of motion. Neck supple. No JVD present. No thyromegaly present.  Cardiovascular: Normal rate, regular rhythm, normal heart sounds and. Exam reveals no gallop and no friction rub. No murmur heard.  Pulmonary/Chest: Effort normal and breath sounds normal. No stridor. No respiratory distress. He has no wheezes. He has no rales. He exhibits no tenderness.  Abdominal: Soft. Bowel sounds are normal. He exhibits no distension. There is no tenderness. There is no rebound and no guarding.  Musculoskeletal: Normal range of motion. He exhibits no edema and no tenderness.  Neurological: He is alert and oriented to person, place, and time. Coordination normal.  Skin: Skin is warm and dry. No rash noted. He is not diaphoretic. No erythema. No pallor.  Psychiatric: He has a normal mood and affect. His behavior is normal. Judgment and thought content normal.   EKG: Sinus  Rhythm  -RSR(V1) -nondiagnostic.   PROBABLY NORMAL   ASSESSMENT AND PLAN

## 2015-01-27 ENCOUNTER — Ambulatory Visit (INDEPENDENT_AMBULATORY_CARE_PROVIDER_SITE_OTHER): Payer: Medicare Other | Admitting: Cardiovascular Disease

## 2015-01-27 DIAGNOSIS — R079 Chest pain, unspecified: Secondary | ICD-10-CM | POA: Diagnosis not present

## 2015-01-28 NOTE — Patient Instructions (Signed)
Normal stress test

## 2015-01-28 NOTE — Progress Notes (Signed)
Normal GXT

## 2015-01-28 NOTE — Procedures (Signed)
   Treadmill Stress test  Indication: Atypical chest pain  Baseline Data:  Resting EKG shows NSR with rate of 86 bpm, no significant ST or T wave changes. Resting blood pressure of 120/84 mm Hg Stand bruce protocal was used.  Exercise Data:  Patient exercised for 9 min 0 sec,  Peak heart rate of 141 bpm.  This was 92 % of the maximum predicted heart rate. No symptoms of chest pain or lightheadedness were reported at peak stress or in recovery.  Peak Blood pressure recorded was 159/73 Maximal work level: 10.1 METs.  Heart rate at 3 minutes in recovery was 95 bpm. BP response: Normal HR response: Normal  EKG with Exercise: Sinus tachycardia with no significant ST changes.  FINAL IMPRESSION: Normal exercise stress test. No significant EKG changes concerning for ischemia. Good exercise tolerance.  Recommendation: The chest pain is likely noncardiac.

## 2015-03-14 ENCOUNTER — Other Ambulatory Visit: Payer: Self-pay | Admitting: Cardiovascular Disease

## 2015-03-30 ENCOUNTER — Other Ambulatory Visit: Payer: Self-pay

## 2016-01-05 IMAGING — MR MR HEAD W/O CM - MRA HEAD W/O CM
10 of 11 series · 36 of 48 positions shown · non-contrast
Comparison: None.

CLINICAL DATA: Progressive headaches. History of posterior
circulation AVM.

EXAM:
MRI HEAD WITHOUT CONTRAST
MRA HEAD WITHOUT CONTRAST
TECHNIQUE: Multiplanar, multiecho pulse sequences of the brain and surrounding
structures were obtained without intravenous contrast. Angiographic
images of the head were obtained using MRA technique without
contrast.

[Series 2: T1 · sagittal · 5.0mm · 0.45mm/px · 2 of 29 slices shown (1 of 2)]
[im 1/29]
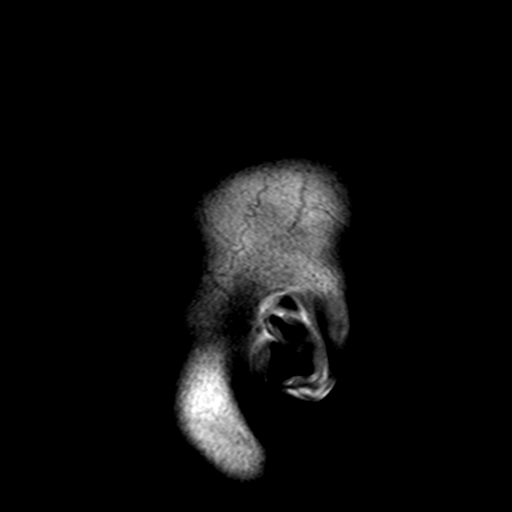
[im 29/29]
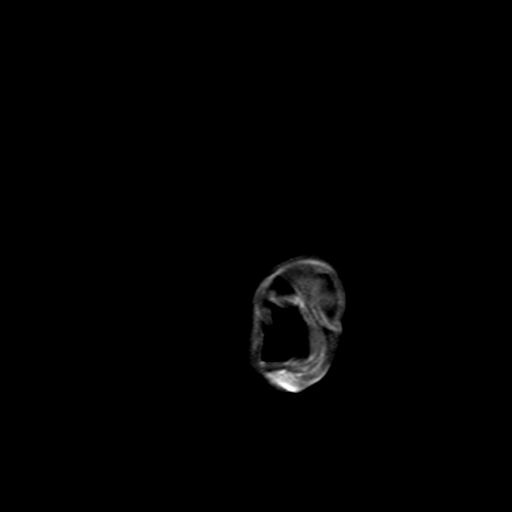

[Series 8: DWI · axial · 5.0mm · 1.80mm/px · z∈[-67,+115]mm · 3 of 29 slices shown (1 of 4)]
[im 1/29]
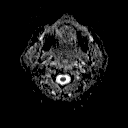
[im 15/29]
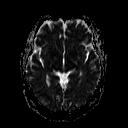
[im 29/29]
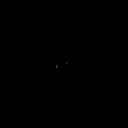

[Series 9: DWI · axial · 5.0mm · 1.80mm/px · z∈[-67,+115]mm · 3 of 26 slices shown (2 of 4)]
[im 1/26]
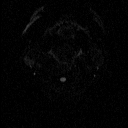
[im 13/26]
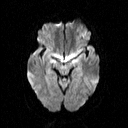
[im 26/26]
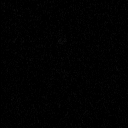

[Series 11: DWI · coronal · 5.0mm · 1.80mm/px · 5 of 43 slices shown (3 of 4)]
[im 1/43]
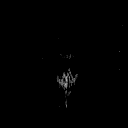
[im 11/43]
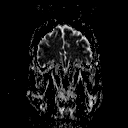
[im 22/43]
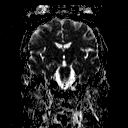
[im 32/43]
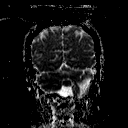
[im 43/43]
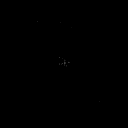

[Series 15: DWI · coronal · 5.0mm · 1.80mm/px · 4 of 38 slices shown (4 of 4)]
[im 1/38]
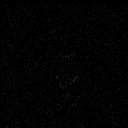
[im 13/38]
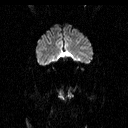
[im 25/38]
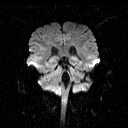
[im 38/38]
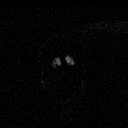

[Series 16: T2 · axial · 5.0mm · 0.45mm/px · z∈[-48,+121]mm · 3 of 27 slices shown (1 of 3)]
[im 1/27]
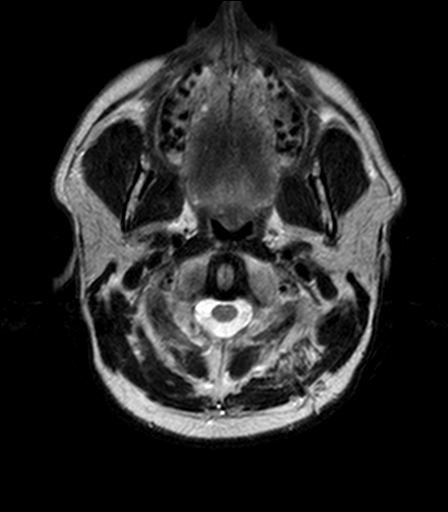
[im 14/27]
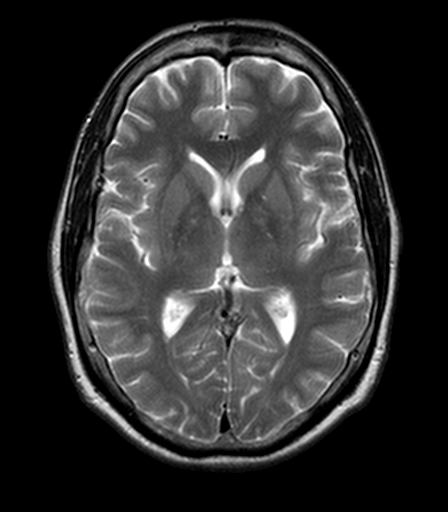
[im 27/27]
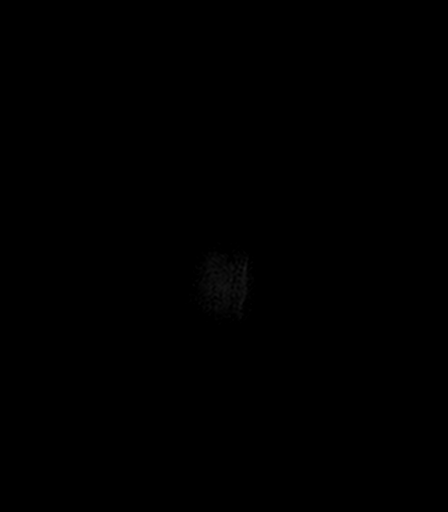

[Series 17: FLAIR · axial · 5.0mm · 0.90mm/px · z∈[-48,+121]mm · 3 of 27 slices shown]
[im 1/27]
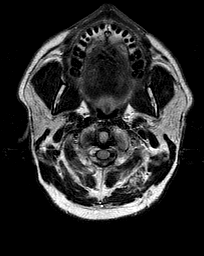
[im 14/27]
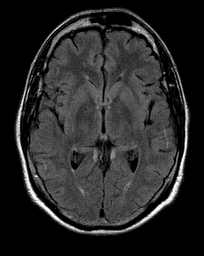
[im 27/27]
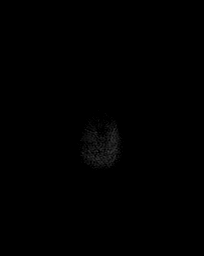

[Series 18: T2 · axial · 5.0mm · 0.45mm/px · z∈[-48,+121]mm · 3 of 27 slices shown (2 of 3)]
[im 1/27]
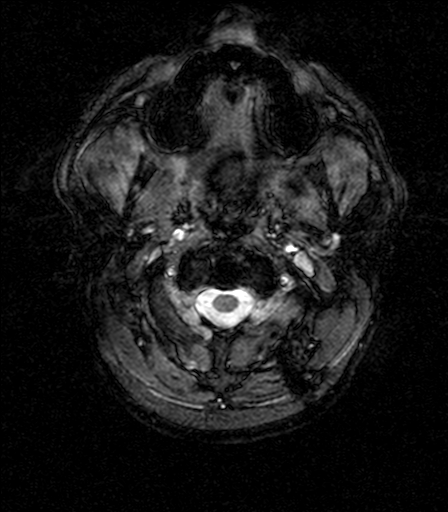
[im 14/27]
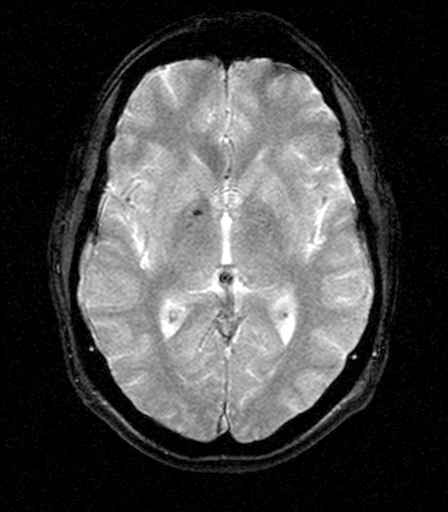
[im 27/27]
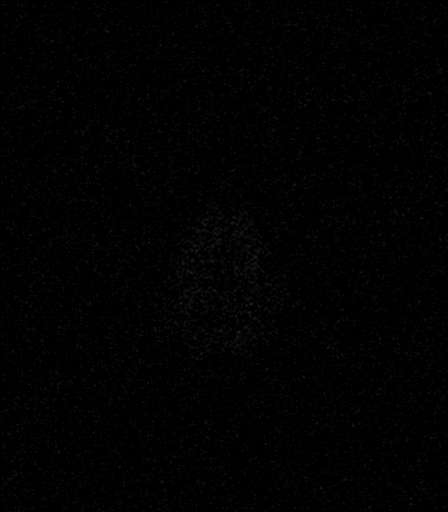

[Series 21: T1 · axial · 3.0mm · 0.45mm/px · z∈[-58,+131]mm · 7 of 64 slices shown (2 of 2)]
[im 1/64]
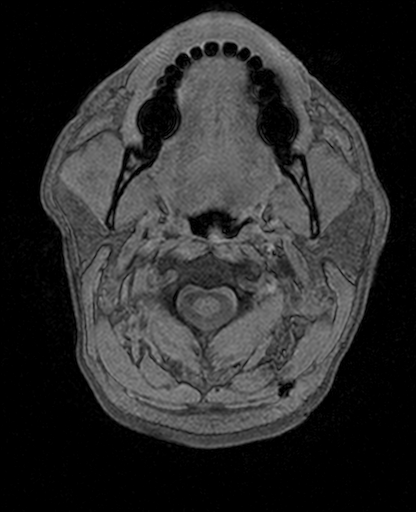
[im 11/64]
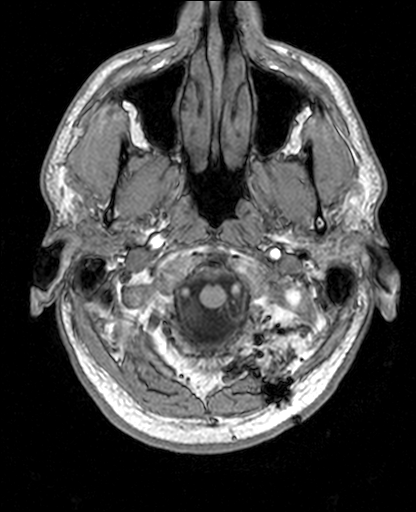
[im 22/64]
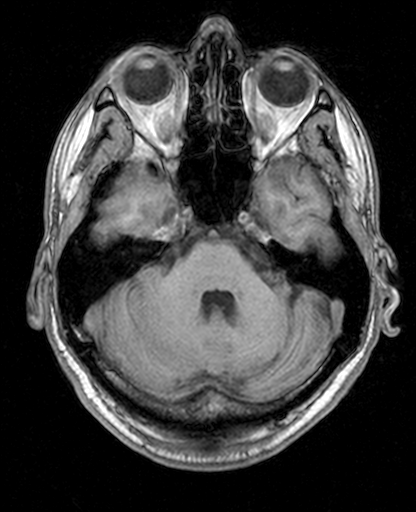
[im 32/64]
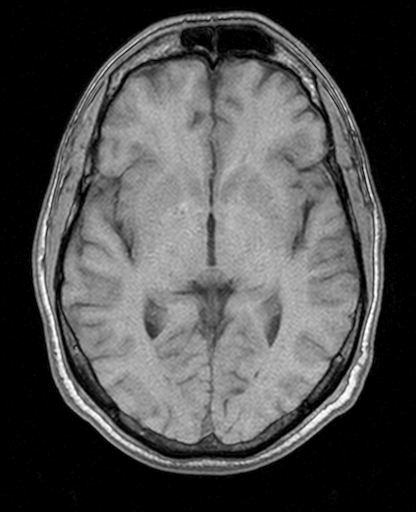
[im 43/64]
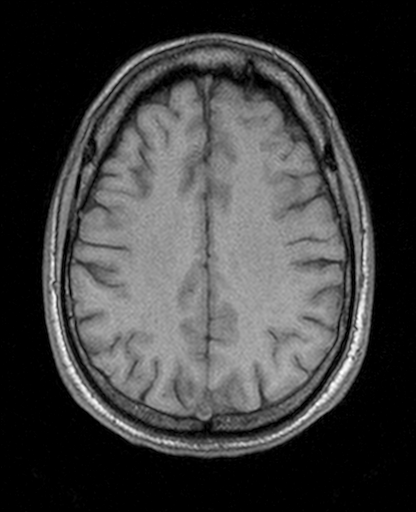
[im 53/64]
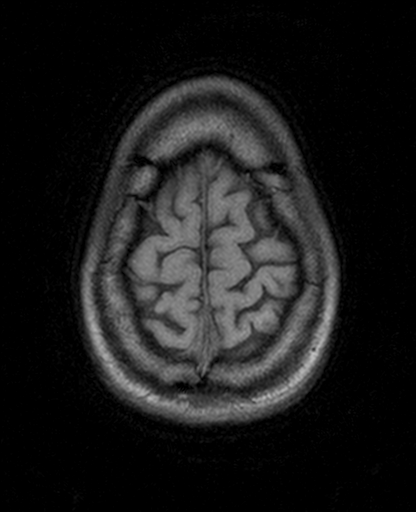
[im 64/64]
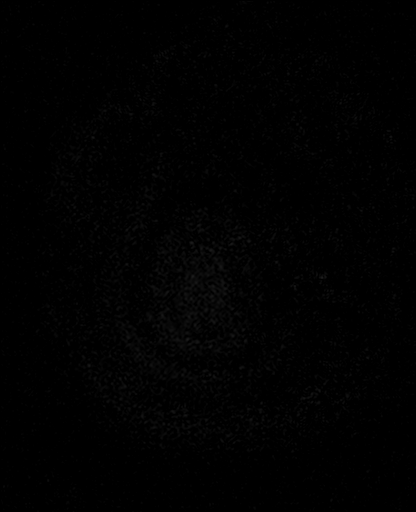

[Series 24: T2 · coronal · 5.0mm · 0.45mm/px · 3 of 33 slices shown (3 of 3)]
[im 1/33]
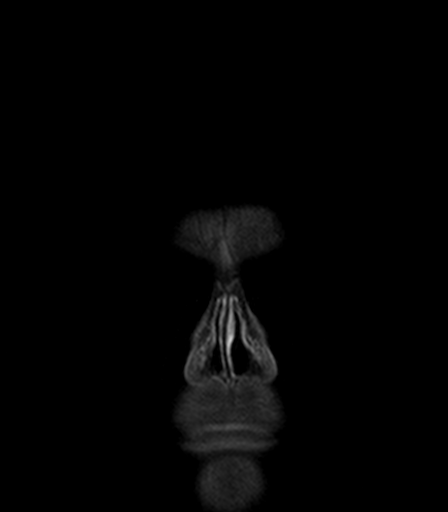
[im 17/33]
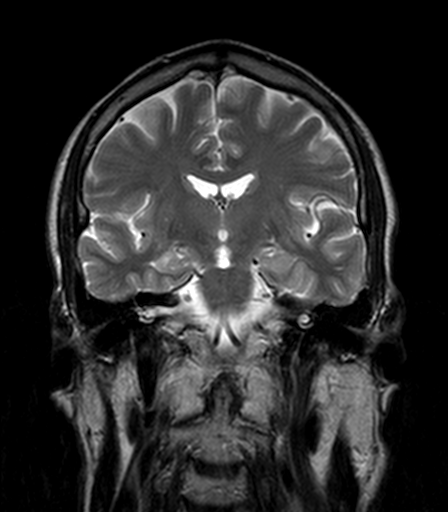
[im 33/33]
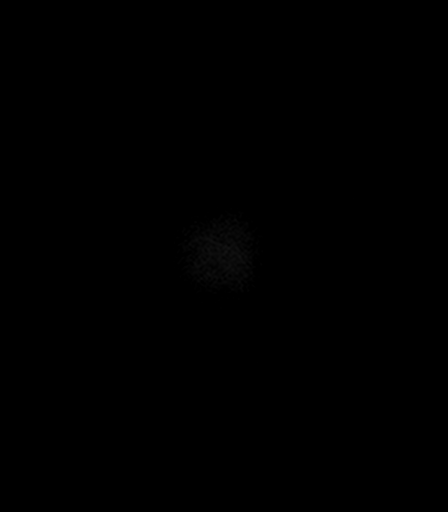

[36 of 48 positions shown; findings below may reference images not displayed]

FINDINGS: MRI HEAD FINDINGS

There is no acute infarct, mass, midline shift, or extra-axial fluid
collection. Sequelae of prior left suboccipital craniectomy are
identified. There is encephalomalacia and gliosis in the underlying
left cerebellar hemisphere. A small amount of chronic blood products
are present in this region. Focus of susceptibility artifact in the
right globus pallidus may reflect asymmetric calcification or remote
microhemorrhage. No significant white matter disease is seen.
Ventricles are normal for age.

Orbits are unremarkable. Paranasal sinuses and mastoid air cells are
clear. Major intracranial vascular flow voids are preserved.

MRA HEAD FINDINGS

Visualized distal vertebral arteries are patent with the left being
slightly larger than the right. Left PICA appears patent, however
its origin was not imaged. Right PICA is not identified. AICA and
SCA origins are patent. Basilar artery is patent without stenosis.
There is a prominent right posterior communicating artery, and the
right P1 segment is hypoplastic. Left PCA is unremarkable. There is
no evidence of recurrent arteriovenous malformation in the posterior
fossa.

Internal carotid arteries are patent from skullbase to carotid
termini without stenosis. ACAs and MCAs are unremarkable. No
intracranial aneurysm is identified.
IMPRESSION: 1. No evidence of acute intracranial abnormality or mass.
2. Postoperative changes in the posterior fossa with left cerebellar
encephalomalacia.
3. No evidence of recurrent arteriovenous malformation or
significant intracranial arterial stenosis.

## 2017-01-17 ENCOUNTER — Other Ambulatory Visit: Payer: Self-pay | Admitting: Cardiovascular Disease

## 2017-02-13 ENCOUNTER — Ambulatory Visit: Payer: Medicare Other | Admitting: Cardiovascular Disease

## 2017-02-21 ENCOUNTER — Encounter: Payer: Self-pay | Admitting: Nurse Practitioner

## 2017-02-21 ENCOUNTER — Ambulatory Visit (INDEPENDENT_AMBULATORY_CARE_PROVIDER_SITE_OTHER): Payer: Medicare Other | Admitting: Nurse Practitioner

## 2017-02-21 VITALS — BP 110/80 | HR 74 | Ht 68.0 in | Wt 198.5 lb

## 2017-02-21 DIAGNOSIS — R079 Chest pain, unspecified: Secondary | ICD-10-CM | POA: Diagnosis not present

## 2017-02-21 NOTE — Patient Instructions (Addendum)
Testing/Procedures: [facility] MYOVIEW  Your caregiver has ordered a Stress Test with nuclear imaging. The purpose of this test is to evaluate the blood supply to your heart muscle. This procedure is referred to as a "Non-Invasive Stress Test." This is because other than having an IV started in your vein, nothing is inserted or "invades" your body. Cardiac stress tests are done to find areas of poor blood flow to the heart by determining the extent of coronary artery disease (CAD).   Please note: these test may take anywhere between 2-4 hours to complete  PLEASE REPORT TO [facility] MEDICAL MALL ENTRANCE  THE VOLUNTEERS AT THE FIRST DESK WILL DIRECT YOU WHERE TO GO  Date of Procedure:_Friday May 25, 2018_  Arrival Time for Procedure:___Arrive at 08:45AM___    PLEASE NOTIFY THE OFFICE AT LEAST 24 HOURS IN ADVANCE IF YOU ARE UNABLE TO KEEP YOUR APPOINTMENT.  [phone] AND  PLEASE NOTIFY NUCLEAR MEDICINE AT [facility] AT LEAST 24 HOURS IN ADVANCE IF YOU ARE UNABLE TO KEEP YOUR APPOINTMENT. [phone]  How to prepare for your Myoview test:  1. Do not eat or drink after midnight 2. No caffeine for 24 hours prior to test 3. No smoking 24 hours prior to test. 4. Your medication may be taken with water.  If your doctor stopped a medication because of this test, do not take that medication. 5. Ladies, please do not wear dresses.  Skirts or pants are appropriate. Please wear a short sleeve shirt. 6. No perfume, cologne or lotion. 7. Wear comfortable walking shoes. No heels!   Follow-Up: Your physician wants you to follow-up in: 1 year with Dr. Kirke CorinArida. You will receive a reminder letter in the mail two months in advance. If you don't receive a letter, please call our office to schedule the follow-up appointment.  It was a pleasure seeing you today here in the office. Please do not hesitate to give us a call back if you have any further questions. [phone]  [place] CellarPamela A. RN, BSN    Cardiac Nuclear  Scan A cardiac nuclear scan is a test that measures blood flow to the heart when a person is resting and when he or she is exercising. The test looks for problems such as:  Not enough blood reaching a portion of the heart.  The heart muscle not working normally. You may need this test if:  You have heart disease.  You have had abnormal lab results.  You have had heart surgery or angioplasty.  You have chest pain.  You have shortness of breath. In this test, a radioactive dye (tracer) is injected into your bloodstream. After the tracer has traveled to your heart, an imaging device is used to measure how much of the tracer is absorbed by or distributed to various areas of your heart. This procedure is usually done at a hospital and takes 2-4 hours. Tell a health care provider about:  Any allergies you have.  All medicines you are taking, including vitamins, herbs, eye drops, creams, and over-the-counter medicines.  Any problems you or family members have had with the use of anesthetic medicines.  Any blood disorders you have.  Any surgeries you have had.  Any medical conditions you have.  Whether you are pregnant or may be pregnant. What are the risks? Generally, this is a safe procedure. However, problems may occur, including:  Serious chest pain and heart attack. This is only a risk if the stress portion of the test is done.  Rapid heartbeat.  Sensation of warmth in your chest. This usually passes quickly. What happens before the procedure?  Ask your health care provider about changing or stopping your regular medicines. This is especially important if you are taking diabetes medicines or blood thinners.  Remove your jewelry on the day of the procedure. What happens during the procedure?  An IV tube will be inserted into one of your veins.  Your health care provider will inject a small amount of radioactive tracer through the tube.  You will wait for 20-40 minutes  while the tracer travels through your bloodstream.  Your heart activity will be monitored with an electrocardiogram (ECG).  You will lie down on an exam table.  Images of your heart will be taken for about 15-20 minutes.  You may be asked to exercise on a treadmill or stationary bike. While you exercise, your heart's activity will be monitored with an ECG, and your blood pressure will be checked. If you are unable to exercise, you may be given a medicine to increase blood flow to parts of your heart.  When blood flow to your heart has peaked, a tracer will again be injected through the IV tube.  After 20-40 minutes, you will get back on the exam table and have more images taken of your heart.  When the procedure is over, your IV tube will be removed. The procedure may vary among health care providers and hospitals. Depending on the type of tracer used, scans may need to be repeated 3-4 hours later. What happens after the procedure?  Unless your health care provider tells you otherwise, you may return to your normal schedule, including diet, activities, and medicines.  Unless your health care provider tells you otherwise, you may increase your fluid intake. This will help flush the contrast dye from your body. Drink enough fluid to keep your urine clear or pale yellow.  It is up to you to get your test results. Ask your health care provider, or the department that is doing the test, when your results will be ready. Summary  A cardiac nuclear scan measures the blood flow to the heart when a person is resting and when he or she is exercising.  You may need this test if you are at risk for heart disease.  Tell your health care provider if you are pregnant.  Unless your health care provider tells you otherwise, increase your fluid intake. This will help flush the contrast dye from your body. Drink enough fluid to keep your urine clear or pale yellow. This information is not intended to  replace advice given to you by your health care provider. Make sure you discuss any questions you have with your health care provider. Document Released: 10/14/2004 Document Revised: 09/21/2016 Document Reviewed: 08/28/2013 Elsevier Interactive Patient Education  2017 [employer].

## 2017-02-21 NOTE — Progress Notes (Signed)
Office Visit    Patient Name: Joel EhrichRobert F Galvan Date of Encounter: 02/21/2017  Primary Care Provider:  Danella PentonMiller, Mark F, MD Primary Cardiologist:  Judie PetitM. Kirke CorinArida, MD   Chief Complaint    71 year old male with prior history of atypical chest pain, nonobstructive CAD, and multiple negative noninvasive studies who presents for follow-up.  Past Medical History    Past Medical History:  Diagnosis Date  . Bursitis   . Chest pain, atypical    a. 03/2006 Cath: EF 55%, mild luminal irregularities; b. 01/2009 Lexi MV: EF 64%, fixed inferior defect w normal wall motion, likely diaphragmatic attenuation. No ischemia;  c. 2014 Ex MV: no ischemia-->c/p improved w/ PPI;  d. 01/2015 ETT: Ex time 9 mins, Max HR 141, no st/t changes.  . CNS (central nervous system disease)    AVM s/p surgery in 1996  . History of echocardiogram    a. 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  Marland Kitchen. Hyperlipidemia   . Hyperuricemia   . Osteoarthritis    hands and feet  . Plantar fasciitis   . Syncope and collapse    history  . Tendonitis    Past Surgical History:  Procedure Laterality Date  . APPENDECTOMY    . CARDIAC CATHETERIZATION  2007   cone  . CRANIOTOMY    . HEMORRHOID SURGERY    . 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 ROTATOR CUFF REPAIR Right     Allergies  Allergies  Allergen Reactions  . Methocarbamol   . Prednisone     History of Present Illness    71 year old male with the above past medical history including atypical chest pain status post catheterization in 2007, revealing nonobstructive CAD. He subsequently underwent noninvasive nuclear stress testing in 2010 and 2014, which were both normal. Chest pain in 2014 was felt to be GI in nature and he was successfully treated with PPI therapy. He was last seen in clinic in 2016, at which  point he again complained of atypical chest pain. Exercise treadmill testing was undertaken and he showed good exercise tolerance, walking 9 minutes without chest pain or ST/T changes.  He has done relatively well over the past 2 years. [place] [place]. Over the past 2-3 months, he is again noted intermittent left sided to left axillary sharp chest discomfort occurring a few days a week without any particular regularity or predictability, lasting a few seconds to 10-20 minutes, without associated symptoms, and resolving spontaneously. This is similar to what he has experienced in the past. Despite this, he has been cycling regularly without any symptoms or limitations during his time on the bicycle. He denies alterations, PND, orthopnea, dizziness, syncope, edema, or early satiety.  Home Medications    Prior to Admission medications   Medication Sig Start Date End Date Taking? Authorizing Provider  albuterol (PROVENTIL HFA;VENTOLIN HFA) 108 (90 Base) MCG/ACT inhaler Inhale into the lungs daily as needed. 02/04/17  Yes [provider]  atorvastatin (LIPITOR) 10 MG tablet Take 5 mg by mouth as directed.    Yes [provider]  azelastine (ASTELIN) 0.1 % nasal spray Place into the nose 2 (two) times daily. 02/04/17  Yes [provider]  benzonatate (TESSALON) 100 MG  capsule Take by mouth as needed for cough.   Yes [provider]  ezetimibe (ZETIA) 10 MG tablet Take 10 mg by mouth daily.     Yes [provider]  Multiple Vitamin (MULTIVITAMIN) tablet Take 1 tablet by mouth daily.   Yes [provider]  naproxen sodium (ANAPROX) 220 MG tablet Take 220 mg by mouth as needed.   Yes [provider]  pantoprazole (PROTONIX) 40 MG tablet TAKE 1 TABLET (40 MG TOTAL) BY MOUTH DAILY. 01/17/17  Yes Iran Ouch, MD    Review of Systems    Left-sided chest discomfort as  outlined above. He denies dyspnea, palpitations, PND, orthopnea, dizziness, syncope, edema, or early satiety.  All other systems reviewed and are otherwise negative except as noted above.  Physical Exam    VS:  There were no vitals taken for this visit. , BMI There is no height or weight on file to calculate BMI. GEN: Well nourished, well developed, in no acute distress.  HEENT: normal.  Neck: Supple, no JVD, carotid bruits, or masses. Cardiac: RRR, no murmurs, rubs, or gallops. No clubbing, cyanosis, edema.  Radials/DP/PT 2+ and equal bilaterally.  Respiratory:  Respirations regular and unlabored, clear to auscultation bilaterally. GI: Soft, nontender, nondistended, BS + x 4. MS: no deformity or atrophy. Skin: warm and dry, no rash. Neuro:  Strength and sensation are intact. Psych: Normal affect.  Accessory Clinical Findings    ECG - Regular sinus rhythm, 74, left axis deviation, incomplete right bundle. Overall similar to prior ECGs.  Assessment & Plan    1.  Left-sided chest pain: Patient with prior history of chest pain and nonobstructive coronary disease by catheterization in 2007 with negative stress testing in 2010, 2014, and 2016. Over the past 3 months, he has had intermittent left-sided sharp chest pain that is not any worse with exertion and does not limit activities. This is similar to prior episodes. He remains very concerned given his father's somewhat premature death in his early 12s from coronary disease. Reasonable to check in with another exercise Myoview to rule out ischemia. ECG is unchanged.  2. Hyperlipidemia: He remains on Lipitor and Zetia therapy. This is followed by his primary care provider.  3. Disposition: Follow-up exercise Myoview. If this is normal, I would not pursue additional ischemic evaluation and he can follow-up with Dr. Kirke Corin in one year.   Nicolasa Ducking, NP 02/21/2017, 8:30 AM

## 2017-02-24 ENCOUNTER — Ambulatory Visit
Admission: RE | Admit: 2017-02-24 | Discharge: 2017-02-24 | Disposition: A | Discharge status: Home / Self Care | Payer: Medicare Other | Source: Ambulatory Visit | Attending: Nurse Practitioner | Admitting: Nurse Practitioner

## 2017-02-24 DIAGNOSIS — R079 Chest pain, unspecified: Secondary | ICD-10-CM | POA: Diagnosis not present

## 2017-02-24 LAB — NM MYOCAR MULTI W/SPECT W/WALL MOTION / EF
CHL CUP NUCLEAR SRS: 1
Estimated workload: 10.1 METS
Exercise duration (min): 9 min
Exercise duration (sec): 15 s
LV sys vol: 34 mL
LVDIAVOL: 86 mL (ref 62–150)
MPHR: 150 {beats}/min
NUC STRESS TID: 0.92
Peak HR: 130 {beats}/min
Percent HR: 86 %
Rest HR: 59 {beats}/min
SDS: 3
SSS: 1

## 2017-02-24 MED ORDER — TECHNETIUM TC 99M TETROFOSMIN IV KIT
13.0000 | PACK | Freq: Once | INTRAVENOUS | Status: AC | PRN
  Administered 2017-02-24: 12.413 via INTRAVENOUS

## 2017-02-24 MED ORDER — TECHNETIUM TC 99M TETROFOSMIN IV KIT
28.4810 | PACK | Freq: Once | INTRAVENOUS | Status: AC | PRN
  Administered 2017-02-24: 28.481 via INTRAVENOUS

## 2017-03-01 ENCOUNTER — Other Ambulatory Visit: Payer: Self-pay | Admitting: *Deleted

## 2017-03-01 DIAGNOSIS — R0789 Other chest pain: Secondary | ICD-10-CM

## 2017-03-28 ENCOUNTER — Ambulatory Visit (INDEPENDENT_AMBULATORY_CARE_PROVIDER_SITE_OTHER): Payer: Medicare Other

## 2017-03-28 ENCOUNTER — Other Ambulatory Visit: Payer: Self-pay

## 2017-03-28 DIAGNOSIS — R0789 Other chest pain: Secondary | ICD-10-CM

## 2017-04-25 ENCOUNTER — Ambulatory Visit (INDEPENDENT_AMBULATORY_CARE_PROVIDER_SITE_OTHER): Payer: Medicare Other | Admitting: Cardiovascular Disease

## 2017-04-25 ENCOUNTER — Encounter: Payer: Self-pay | Admitting: Cardiovascular Disease

## 2017-04-25 VITALS — BP 120/78 | HR 72 | Ht 68.0 in | Wt 201.8 lb

## 2017-04-25 DIAGNOSIS — R079 Chest pain, unspecified: Secondary | ICD-10-CM | POA: Diagnosis not present

## 2017-04-25 DIAGNOSIS — E785 Hyperlipidemia, unspecified: Secondary | ICD-10-CM | POA: Diagnosis not present

## 2017-04-25 NOTE — Patient Instructions (Signed)
Medication Instructions: Continue same medications.   Labwork: None.   Procedures/Testing: None.   Follow-Up: 12 months with Dr. Arida.   Any Additional Special Instructions Will Be Listed Below (If Applicable).     If you need a refill on your cardiac medications before your next appointment, please call your pharmacy.   

## 2017-04-25 NOTE — Progress Notes (Signed)
Cardiology Office Note   Date:  04/25/2017   ID:  Joel EhrichRobert F Coole, DOB 02/05/1946, MRN [id number]  PCP:  Danella PentonMiller, Mark F, MD  Cardiologist:   Lorine BearsMuhammad Arida, MD   Chief Complaint  Patient presents with  . other    Follow up from Myoview and Echo. Meds reviewed by the pt. verbally.       History of Present Illness: Joel EhrichRobert F Galvan is a 71 y.o. male who presents for A follow-up visit regarding chronic atypical chest pain. He had previous cardiac catheterization in 2007 which showed minor luminal irregularities without evidence of obstructive disease. He had multiple nuclear stress test since then for intermittent chest pain with negative results.   Some of his symptoms were felt to be GI in nature.  He was seen recently by Ward Givenshris Berge for atypical chest pain. He underwent a treadmill nuclear stress test which showed no evidence of ischemia. Ejection fraction was mildly depressed but thought to be due to an artifact. He had an echocardiogram done which showed normal LV systolic function with no significant valvular abnormalities. He has been doing well overall but he injured his back with significant back discomfort. He went to urgent care today and was given a prescription for Roxicodone and a muscle relaxant. Up until his recent back pain, he continued to be active with swimming and biking.  Past Medical History:  Diagnosis Date  . Bursitis   . Chest pain, atypical    a. 03/2006 Cath: EF 55%, mild luminal irregularities; b. 01/2009 Lexi MV: EF 64%, fixed inferior defect w normal wall motion, likely diaphragmatic attenuation. No ischemia;  c. 2014 Ex MV: no ischemia-->c/p improved w/ PPI;  d. 01/2015 ETT: Ex time 9 mins, Max HR 141, no st/t changes.  . CNS (central nervous system disease)    AVM s/p surgery in 1996  . History of echocardiogram    a. 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 Kirke CorinArida - felt to be Ca2+  trabeculation), mildly dil LA.  Marland Kitchen. Hyperlipidemia   . Hyperuricemia   . Osteoarthritis    hands and feet  . Plantar fasciitis   . Sinusitis   . Syncope and collapse    history  . Tendonitis     Past Surgical History:  Procedure Laterality Date  . APPENDECTOMY    . CARDIAC CATHETERIZATION  2007   cone  . CRANIOTOMY    . HEMORRHOID SURGERY    . 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 ROTATOR CUFF REPAIR Right      Current Outpatient Prescriptions  Medication Sig Dispense Refill  . albuterol (PROVENTIL HFA;VENTOLIN HFA) 108 (90 Base) MCG/ACT inhaler Inhale into the lungs daily as needed.    Marland Kitchen. atorvastatin (LIPITOR) 10 MG tablet Take 5 mg by mouth as directed.     Marland Kitchen. azelastine (ASTELIN) 0.1 % nasal spray Place into the nose 2 (two) times daily.    . benzonatate (TESSALON) 100 MG capsule Take by mouth as needed for cough.    . ezetimibe (ZETIA) 10 MG tablet Take 10 mg by mouth daily.      . Multiple Vitamin (MULTIVITAMIN) tablet Take 1 tablet by mouth daily.    . naproxen sodium (ANAPROX) 220 MG tablet Take 220 mg by mouth as needed.    . pantoprazole (PROTONIX) 40 MG tablet TAKE 1 TABLET (40 MG TOTAL) BY MOUTH  DAILY. 30 tablet 3   No current facility-administered medications for this visit.     Allergies:   Methocarbamol and Prednisone    Social History:  The patient  reports that he has never smoked. He has never used smokeless tobacco. He reports that he drinks alcohol. He reports that he does not use drugs.   Family History:  The patient's family history includes Heart attack (age of onset: 41) in his father.    ROS:  Please see the history of present illness.   Otherwise, review of systems are positive for none.   All other systems are reviewed and negative.    PHYSICAL EXAM: VS:  BP 120/78 (BP Location: Left Arm, Patient Position: Sitting, Cuff Size: Normal)   Pulse 72   Ht 5\' 8"  (1.727 m)   Wt 201 lb 12 oz (91.5 kg)   BMI 30.68  kg/m  , BMI Body mass index is 30.68 kg/m. GEN: Well nourished, well developed, in no acute distress  HEENT: normal  Neck: no JVD, carotid bruits, or masses Cardiac: RRR; no murmurs, rubs, or gallops,no edema  Respiratory:  clear to auscultation bilaterally, normal work of breathing GI: soft, nontender, nondistended, + BS MS: no deformity or atrophy  Skin: warm and dry, no rash Neuro:  Strength and sensation are intact Psych: euthymic mood, full affect   EKG:  EKG is not ordered today.   Recent Labs: No results found for requested labs within last 8760 hours.    Lipid Panel    Component Value Date/Time   CHOL 160 12/11/2014 0825   TRIG 88 12/11/2014 0825   HDL 52 12/11/2014 0825   CHOLHDL 3.1 12/11/2014 0825   CHOLHDL 3.6 Ratio 11/05/2010 2116   VLDL 19 11/05/2010 2116   LDLCALC 90 12/11/2014 0825      Wt Readings from Last 3 Encounters:  04/25/17 201 lb 12 oz (91.5 kg)  02/21/17 198 lb 8 oz (90 kg)  01/26/15 207 lb 8 oz (94.1 kg)     No flowsheet data found.    ASSESSMENT AND PLAN:  1.  Atypical chest pain: Negative recent cardiac workup: Symptoms overall improved. No further workup at the present time. Given his risk factors, I suggested that he considers taking aspirin 81 mg once daily. He used to do that but stopped due to recurrent nosebleed.  2. Hyperlipidemia: Currently on low dose atorvastatin and Zetia. I reviewed lipid profile from last year which showed an LDL of 75.   Disposition:   FU with me in 1 year  Signed,  Lorine Bears, MD  04/25/2017 2:23 PM    [facility]

## 2017-11-05 ENCOUNTER — Emergency Department
Admission: EM | Admit: 2017-11-05 | Discharge: 2017-11-05 | Disposition: A | Discharge status: Home / Self Care | Payer: Medicare Other | Attending: Emergency Medicine | Admitting: Emergency Medicine

## 2017-11-05 ENCOUNTER — Other Ambulatory Visit: Payer: Self-pay

## 2017-11-05 ENCOUNTER — Encounter: Payer: Self-pay | Admitting: Emergency Medicine

## 2017-11-05 ENCOUNTER — Emergency Department: Payer: Medicare Other

## 2017-11-05 DIAGNOSIS — R0789 Other chest pain: Secondary | ICD-10-CM

## 2017-11-05 DIAGNOSIS — M549 Dorsalgia, unspecified: Secondary | ICD-10-CM | POA: Insufficient documentation

## 2017-11-05 DIAGNOSIS — R079 Chest pain, unspecified: Secondary | ICD-10-CM | POA: Insufficient documentation

## 2017-11-05 LAB — BASIC METABOLIC PANEL
Anion gap: 9 (ref 5–15)
BUN: 20 mg/dL (ref 6–20)
CALCIUM: 9.2 mg/dL (ref 8.9–10.3)
CO2: 23 mmol/L (ref 22–32)
CREATININE: 1.22 mg/dL (ref 0.61–1.24)
Chloride: 104 mmol/L (ref 101–111)
GFR calc Af Amer: >60 mL/min (ref >60)
GFR, EST NON AFRICAN AMERICAN: 58 mL/min — AB (ref >60)
Glucose, Bld: 112 mg/dL — ABNORMAL HIGH (ref 65–99)
Potassium: 4.2 mmol/L (ref 3.5–5.1)
SODIUM: 136 mmol/L (ref 135–145)

## 2017-11-05 LAB — CBC
HCT: 46.3 % (ref 40.0–52.0)
Hemoglobin: 16 g/dL (ref 13.0–18.0)
MCH: 30.4 pg (ref 26.0–34.0)
MCHC: 34.6 g/dL (ref 32.0–36.0)
MCV: 87.9 fL (ref 80.0–100.0)
PLATELETS: 213 10*3/uL (ref 150–440)
RBC: 5.26 MIL/uL (ref 4.40–5.90)
RDW: 13.4 % (ref 11.5–14.5)
WBC: 8.8 10*3/uL (ref 3.8–10.6)

## 2017-11-05 LAB — TROPONIN I
Troponin I: <0.03 ng/mL (ref <0.03)
Troponin I: <0.03 ng/mL (ref <0.03)

## 2017-11-05 LAB — FIBRIN DERIVATIVES D-DIMER (ARMC ONLY): Fibrin derivatives D-dimer (ARMC): 224.3 ng/mL (FEU) (ref 0.00–499.00)

## 2017-11-05 MED ORDER — MORPHINE SULFATE (PF) 4 MG/ML IV SOLN
4.0000 mg | Freq: Once | INTRAVENOUS | Status: AC
  Administered 2017-11-05: 4 mg via INTRAVENOUS
  Filled 2017-11-05: qty 1

## 2017-11-05 MED ORDER — OXYCODONE-ACETAMINOPHEN 5-325 MG PO TABS: 1.0000 | ORAL_TABLET | Freq: Four times a day (QID) | ORAL | 0 refills | Status: DC | PRN

## 2017-11-05 NOTE — ED Provider Notes (Signed)
[facility] Emergency Department Provider Note       Time seen: ----------------------------------------- 4:48 PM on 11/05/2017 -----------------------------------------   I have reviewed the triage vital signs and the nursing notes.  HISTORY   Chief Complaint Chest Pain and Back Pain    HPI Joel Galvan is a 72 y.o. male with a history of atypical chest pain, hyperlipidemia and osteoarthritis who presents to the ED for chest pain for the past 2 days.  Patient reports chest pain has been persistent for the last 2 hours.  He has also experienced some pain in his upper mid back.  He reports intermittent sharp pain at times but denies any shortness of breath or any other symptoms.  He denies fevers, chills or other complaints.  Past Medical History:  Diagnosis Date  . Bursitis   . Chest pain, atypical    a. 03/2006 Cath: EF 55%, mild luminal irregularities; b. 01/2009 Lexi MV: EF 64%, fixed inferior defect w normal wall motion, likely diaphragmatic attenuation. No ischemia;  c. 2014 Ex MV: no ischemia-->c/p improved w/ PPI;  d. 01/2015 ETT: Ex time 9 mins, Max HR 141, no st/t changes.  . CNS (central nervous system disease)    AVM s/p surgery in 1996  . History of echocardiogram    a. 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  Marland Kitchen Hyperlipidemia   . Hyperuricemia   . Osteoarthritis    hands and feet  . Plantar fasciitis   . Sinusitis   . Syncope and collapse    history  . Tendonitis     Patient Active Problem List   Diagnosis Date Noted  . Elevated blood pressure 12/09/2014  . Hyperlipidemia 09/04/2009  . Chest pain 09/04/2009    Past Surgical History:  Procedure Laterality Date  . APPENDECTOMY    . CARDIAC CATHETERIZATION  2007   cone  . CRANIOTOMY    . HEMORRHOID SURGERY    . 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 ROTATOR CUFF REPAIR Right     Allergies Methocarbamol and Prednisone  Social History Social History   Tobacco Use  . Smoking status: Never Smoker  . Smokeless tobacco: Never Used  Substance Use Topics  . Alcohol use: Yes    Comment: MODERATE  . Drug use: No    Review of Systems Constitutional: Negative for fever. Cardiovascular: Positive for chest pain Respiratory: Negative for shortness of breath. Gastrointestinal: Negative for abdominal pain, vomiting and diarrhea. Musculoskeletal: Positive for back pain Skin: Negative for rash. Neurological: Negative for headaches, focal weakness or numbness.  All systems negative/normal/unremarkable except as stated in the HPI  ____________________________________________   PHYSICAL EXAM:  VITAL SIGNS: ED Triage Vitals  Enc Vitals Group     BP 11/05/17 1455 (!) 150/100     Pulse Rate 11/05/17 1455 91     Resp 11/05/17 1455 20     Temp 11/05/17 1455 98.8 F (37.1 C)     Temp Source 11/05/17 1455 Oral     SpO2 11/05/17 1455 96 %     Weight 11/05/17 1456 196 lb (88.9 kg)     Height 11/05/17 1456 5\' 8"  (1.727 m)     Head Circumference --      Peak Flow --      Pain Score 11/05/17 1456 3  Pain Loc --      Pain Edu? --      Excl. in GC? --     Constitutional: Alert and oriented. Well appearing and in no distress. Eyes: Conjunctivae are normal. Normal extraocular movements. ENT   Head: Normocephalic and atraumatic.   Nose: No congestion/rhinnorhea.   Mouth/Throat: Mucous membranes are moist.   Neck: No stridor. Cardiovascular: Normal rate, regular rhythm. No murmurs, rubs, or gallops. Respiratory: Normal respiratory effort without tachypnea nor retractions. Breath sounds are clear and equal bilaterally. No wheezes/rales/rhonchi. Gastrointestinal: Soft and nontender. Normal bowel sounds Musculoskeletal: Nontender with normal range of motion in extremities. No lower  extremity tenderness nor edema. Neurologic:  Normal speech and language. No gross focal neurologic deficits are appreciated.  Skin:  Skin is warm, dry and intact. No rash noted. Psychiatric: Mood and affect are normal. Speech and behavior are normal.  ____________________________________________  EKG: Interpreted by me.  Sinus rhythm rate 84 bpm, normal PR interval, normal QRS, normal QT.  EKG interpreted by me, sinus rhythm the rate 65 bpm, normal PR interval, normal QRS, normal QT. ____________________________________________  ED COURSE:  As part of my medical decision making, I reviewed the following data within the electronic MEDICAL RECORD NUMBER History obtained from family if available, nursing notes, old chart and ekg, as well as notes from prior ED visits. Patient presented for chest pain, we will assess with labs and imaging as indicated at this time.   Procedures ____________________________________________   LABS (pertinent positives/negatives)  Labs Reviewed  BASIC METABOLIC PANEL - Abnormal; Notable for the following components:      Result Value   Glucose, Bld 112 (*)    GFR calc non Af Amer 58 (*)    All other components within normal limits  CBC  TROPONIN I  TROPONIN I  FIBRIN DERIVATIVES D-DIMER ([facility] ONLY)   RADIOLOGY Images were viewed by me  Chest x-ray Is unremarkable ____________________________________________  DIFFERENTIAL DIAGNOSIS   Unstable angina, musculoskeletal pain, GERD, PE, pneumothorax, MI, dissection  FINAL ASSESSMENT AND PLAN  Chest pain   Plan: Patient had presented for chest pain when he which he has had intermittently and chronically. Patient's labs are reassuring including repeat troponin and d-dimer. Patient's imaging was also negative.  No clear etiology for this chest pain which has been coming and going for some time.  He is cleared for outpatient follow-up with cardiology.   Ulice DashJohnathan E Carola Viramontes, MD   Note: This note was  generated in part or whole with voice recognition software. Voice recognition is usually quite accurate but there are transcription errors that can and very often do occur. I apologize for any typographical errors that were not detected and corrected.     Emily FilbertWilliams, Safina Huard E, MD 11/05/17 [phone]

## 2017-11-05 NOTE — ED Triage Notes (Signed)
Pt reports chest pain for past 2 days reports today chest pain persistent for past 2 hours reports feeling pain to upper mid back, reports intermittent sharp pain at times denies any shortness of breath denies any other symptom. Pt talks in complete sentences no distress noted

## 2017-11-21 ENCOUNTER — Ambulatory Visit: Payer: Medicare Other | Admitting: Nurse Practitioner

## 2017-11-21 ENCOUNTER — Encounter: Payer: Self-pay | Admitting: Nurse Practitioner

## 2017-11-21 VITALS — BP 110/80 | HR 69 | Ht 68.0 in | Wt 201.2 lb

## 2017-11-21 DIAGNOSIS — R0789 Other chest pain: Secondary | ICD-10-CM

## 2017-11-21 DIAGNOSIS — E782 Mixed hyperlipidemia: Secondary | ICD-10-CM | POA: Diagnosis not present

## 2017-11-21 NOTE — Progress Notes (Signed)
Office Visit    Patient Name: Joel Galvan Date of Encounter: 11/21/2017  Primary Care Provider:  Danella Penton, MD Primary Cardiologist:  Lorine Bears, MD  Chief Complaint    72 y/o ? with prior history of atypical chest pain, nonobstructive CAD, multiple negative noninvasive studies, and hyperlipidemia who presents for follow-up after recent bout of recurrent chest pain.  Past Medical History    Past Medical History:  Diagnosis Date  . Bursitis   . Chest pain, atypical    a. 03/2006 Cath: EF 55%, mild luminal irregularities; b. 01/2009 Lexi MV: EF 64%, fixed inferior defect w normal wall motion, likely diaphragmatic attenuation. No ischemia;  c. 2014 Ex MV: no ischemia-->c/p improved w/ PPI;  d. 01/2015 ETT: Ex time 9 mins, Max HR 141, no st/t changes; e. 01/2017 Ex MV: No isch, EF 46% (artifact - no by echo).  . CNS (central nervous system disease)    AVM s/p surgery in 1996  . History of echocardiogram    a. 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 03/2017 Echo: EF 55-60%, no rwma, Asc Ao 3.34mm, nl RV fxn, mildly dil RA.  Marland Kitchen Hyperlipidemia   . Hyperuricemia   . Osteoarthritis    hands and feet  . Plantar fasciitis   . Sinusitis   . Syncope and collapse    history  . Tendonitis    Past Surgical History:  Procedure Laterality Date  . APPENDECTOMY    . CARDIAC CATHETERIZATION  2007   cone  . CRANIOTOMY    . HEMORRHOID SURGERY    . 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 ROTATOR CUFF REPAIR Right     Allergies  Allergies  Allergen Reactions  . Methocarbamol Other (See Comments)  . Prednisone Other (See Comments)    Necrosis in hip joints Necrosis in hip joints     History of Present Illness    72 year old ? with the above past medical history including atypical chest pain status post  catheterization in 2007 revealing nonobstructive CAD.  He subsequently underwent noninvasive nuclear stress testing in 2010, 2014, 2016, and most recently in May 2018.  Each of these tests were nonischemic.  He also underwent echocardiogram in June 2018 which showed normal LV function.  He was last seen in clinic in August.  Since then, for the most part, he had done well.  As long as the temperature is greater than 50 degrees outside, he will typically ride his bicycle.  Though he has occasionally noted a brief episode of left-sided and axillary chest discomfort, lasting just a few minutes, this never occurs with exertion, and was only occurring rarely between August and February.  In January, he began to note more frequent episodes of left-sided chest discomfort occurring randomly and lasting a few minutes.  In early February, he was in [place] visiting family and was noticing more frequent episodes.  He was also having some back pain.  On driving home from [place] on February 3, chest pain became more persistent and severe.  It lasted several hours prompting him to present to the ER at [facility].  There, ECG was normal.  Troponin and d-dimer were normal.  Labs were otherwise unrevealing.  Patient was stable and subsequently discharged.  He was advised to increase his PPI therapy to  40 mg twice daily.  He notes that he was not taking it regularly prior to the ER visit.  Since discharge from the ER, he had a few episodes of brief left-sided chest discomfort at rest but overall, he has been feeling better.  He has not had any exertional symptoms.  He thinks the PPI is helping.  He denies dyspnea, palpitations, PND, orthopnea, dizziness, syncope, edema, or early satiety.  Home Medications    Prior to Admission medications   Medication Sig Start Date End Date Taking? Authorizing Provider  atorvastatin (LIPITOR) 10 MG tablet Take 5 mg by mouth as directed.    Yes [provider]  azelastine  (ASTELIN) 0.1 % nasal spray Place into the nose 2 (two) times daily. 02/04/17  Yes [provider]  benzonatate (TESSALON) 100 MG capsule Take by mouth as needed for cough.   Yes [provider]  ezetimibe (ZETIA) 10 MG tablet Take 10 mg by mouth daily.     Yes [provider]  Multiple Vitamin (MULTIVITAMIN) tablet Take 1 tablet by mouth daily.   Yes [provider]  naproxen sodium (ANAPROX) 220 MG tablet Take 220 mg by mouth as needed.   Yes [provider]  oxyCODONE-acetaminophen (PERCOCET) 5-325 MG tablet Take 1-2 tablets by mouth every 6 (six) hours as needed. 11/05/17  Yes Emily Filbert, MD  pantoprazole (PROTONIX) 40 MG tablet TAKE 1 TABLET (40 MG TOTAL) BY MOUTH DAILY. 01/17/17  Yes Iran Ouch, MD    Review of Systems    Intermittent left-sided chest discomfort similar to prior episodes, lasting a few minutes, and resolving spontaneously with one prolonged episode on February 3.  Otherwise, he has been doing well without dyspnea, PND, orthopnea, dizziness, syncope, edema, early satiety, or palpitations.  All other systems reviewed and are otherwise negative except as noted above.  Physical Exam    VS:  BP 110/80 (BP Location: Left Arm, Patient Position: Sitting, Cuff Size: Normal)   Pulse 69   Ht 5\' 8"  (1.727 m)   Wt 201 lb 4 oz (91.3 kg)   BMI 30.60 kg/m  , BMI Body mass index is 30.6 kg/m. GEN: Well nourished, well developed, in no acute distress.  HEENT: normal.  Neck: Supple, no JVD, carotid bruits, or masses. Cardiac: RRR, no murmurs, rubs, or gallops. No clubbing, cyanosis, edema.  Radials/DP/PT 2+ and equal bilaterally.  Respiratory:  Respirations regular and unlabored, clear to auscultation bilaterally. GI: Soft, nontender, nondistended, BS + x 4. MS: no deformity or atrophy. Skin: warm and dry, no rash. Neuro:  Strength and sensation are intact. Psych: Normal affect.  Accessory Clinical Findings    ECG  -regular sinus rhythm, 69, left axis deviation, incomplete right bundle branch block, no acute changes.  Assessment & Plan    1.  Atypical chest pain: Patient has a long history of intermittent left-sided and left axillary chest discomfort typically occurring at rest, lasting a few minutes, and resolving spontaneously.  He has had multiple evaluations for this including diagnostic catheterization in 2007 which showed nonobstructive CAD, and stress test in 2010, 14, 16, and most recently May 2018, all of which being nonischemic.  He was recently seen in the emergency department with a prolonged episode of chest discomfort, lasting several hours.  Despite prolonged duration, troponin was normal.  Since his ER visit, he has been taking his PPI as prescribed and he has been feeling better with less and less symptoms.  In light of prior  negative evaluations and reassuring ER evaluation, we have collectively decided to forego any additional ischemic evaluation at this time.  He continues to exercise without limitations.  I advised that if he were to begin to notice an increase in frequency of symptoms occurring with activity, we could always consider additional ischemic evaluation potentially to include cardiac CT angiography.  He remains on statin and Zetia therapy.  He was previously advised to start aspirin but has not been taking this.  2.  GERD: Not taking PPI on a daily basis as prescribed.  He has had some improvement in left-sided chest pain symptoms.  3.  Hyperlipidemia: He remains on Lipitor and Zetia with an LDL of 97 in August 2018.  This is followed by primary care.  4.  Disposition: Follow-up with Dr. Kirke CorinArida in 6 months or sooner if necessary.   Joel Duckinghristopher Lalitha Ilyas, NP 11/21/2017, 9:45 AM

## 2017-11-21 NOTE — Patient Instructions (Signed)
Medication Instructions:  No changes  Labwork: None  Testing/Procedures: None  Follow-Up: Your physician wants you to follow-up in: 6 months with Dr. Kirke CorinArida. You will receive a reminder letter in the mail two months in advance. If you don't receive a letter, please call our office to schedule the follow-up appointment.  It was a pleasure seeing you today here in the office. Please do not hesitate to give us a call back if you have any further questions. [phone]  Banning CellarPamela A. RN, BSN

## 2017-11-29 HISTORY — PX: BALLOON SINUPLASTY: SHX5740

## 2018-07-05 ENCOUNTER — Other Ambulatory Visit: Payer: Self-pay | Admitting: Sports Medicine

## 2018-07-05 DIAGNOSIS — G8929 Other chronic pain: Secondary | ICD-10-CM

## 2018-07-05 DIAGNOSIS — M25562 Pain in left knee: Principal | ICD-10-CM

## 2018-07-24 ENCOUNTER — Ambulatory Visit
Admission: RE | Admit: 2018-07-24 | Discharge: 2018-07-24 | Disposition: A | Discharge status: Home / Self Care | Payer: Medicare Other | Source: Ambulatory Visit | Attending: Sports Medicine | Admitting: Sports Medicine

## 2018-07-24 DIAGNOSIS — M1712 Unilateral primary osteoarthritis, left knee: Secondary | ICD-10-CM | POA: Diagnosis not present

## 2018-07-24 DIAGNOSIS — S83242A Other tear of medial meniscus, current injury, left knee, initial encounter: Secondary | ICD-10-CM | POA: Diagnosis not present

## 2018-07-24 DIAGNOSIS — G8929 Other chronic pain: Secondary | ICD-10-CM

## 2018-07-24 DIAGNOSIS — M25562 Pain in left knee: Secondary | ICD-10-CM | POA: Diagnosis present

## 2018-10-01 ENCOUNTER — Encounter: Payer: Self-pay | Admitting: *Deleted

## 2018-10-01 ENCOUNTER — Other Ambulatory Visit: Payer: Self-pay

## 2018-10-04 ENCOUNTER — Encounter
Admission: RE | Disposition: A | Discharge status: Home / Self Care | Payer: Self-pay | Source: Home / Self Care | Attending: Orthopedic Surgery

## 2018-10-04 ENCOUNTER — Ambulatory Visit
Admission: RE | Admit: 2018-10-04 | Discharge: 2018-10-04 | Disposition: A | Discharge status: Home / Self Care | Payer: Medicare Other | Attending: Orthopedic Surgery | Admitting: Orthopedic Surgery

## 2018-10-04 ENCOUNTER — Ambulatory Visit: Payer: Medicare Other | Admitting: Anesthesiology

## 2018-10-04 DIAGNOSIS — Z833 Family history of diabetes mellitus: Secondary | ICD-10-CM | POA: Insufficient documentation

## 2018-10-04 DIAGNOSIS — X500XXA Overexertion from strenuous movement or load, initial encounter: Secondary | ICD-10-CM | POA: Insufficient documentation

## 2018-10-04 DIAGNOSIS — Z888 Allergy status to other drugs, medicaments and biological substances status: Secondary | ICD-10-CM | POA: Diagnosis not present

## 2018-10-04 DIAGNOSIS — M1712 Unilateral primary osteoarthritis, left knee: Secondary | ICD-10-CM | POA: Insufficient documentation

## 2018-10-04 DIAGNOSIS — Z8249 Family history of ischemic heart disease and other diseases of the circulatory system: Secondary | ICD-10-CM | POA: Diagnosis not present

## 2018-10-04 DIAGNOSIS — Z96641 Presence of right artificial hip joint: Secondary | ICD-10-CM | POA: Diagnosis not present

## 2018-10-04 DIAGNOSIS — E785 Hyperlipidemia, unspecified: Secondary | ICD-10-CM | POA: Diagnosis not present

## 2018-10-04 DIAGNOSIS — Z8261 Family history of arthritis: Secondary | ICD-10-CM | POA: Diagnosis not present

## 2018-10-04 DIAGNOSIS — Z79899 Other long term (current) drug therapy: Secondary | ICD-10-CM | POA: Insufficient documentation

## 2018-10-04 DIAGNOSIS — Z8262 Family history of osteoporosis: Secondary | ICD-10-CM | POA: Insufficient documentation

## 2018-10-04 DIAGNOSIS — Z8042 Family history of malignant neoplasm of prostate: Secondary | ICD-10-CM | POA: Diagnosis not present

## 2018-10-04 DIAGNOSIS — S83232A Complex tear of medial meniscus, current injury, left knee, initial encounter: Secondary | ICD-10-CM | POA: Diagnosis not present

## 2018-10-04 DIAGNOSIS — K219 Gastro-esophageal reflux disease without esophagitis: Secondary | ICD-10-CM | POA: Insufficient documentation

## 2018-10-04 DIAGNOSIS — Z801 Family history of malignant neoplasm of trachea, bronchus and lung: Secondary | ICD-10-CM | POA: Insufficient documentation

## 2018-10-04 DIAGNOSIS — S83242A Other tear of medial meniscus, current injury, left knee, initial encounter: Secondary | ICD-10-CM | POA: Diagnosis present

## 2018-10-04 HISTORY — PX: KNEE ARTHROSCOPY WITH MENISCAL REPAIR: SHX5653

## 2018-10-04 SURGERY — ARTHROSCOPY, KNEE, WITH MENISCUS REPAIR
Anesthesia: General | Site: Knee | Laterality: Left

## 2018-10-04 MED ORDER — LIDOCAINE HCL (CARDIAC) PF 100 MG/5ML IV SOSY
PREFILLED_SYRINGE | INTRAVENOUS | Status: DC | PRN
  Administered 2018-10-04: 40 mg via INTRATRACHEAL

## 2018-10-04 MED ORDER — LIDOCAINE-EPINEPHRINE 1 %-1:100000 IJ SOLN
INTRAMUSCULAR | Status: DC | PRN
  Administered 2018-10-04: 3 mL

## 2018-10-04 MED ORDER — PROPOFOL 10 MG/ML IV BOLUS
INTRAVENOUS | Status: DC | PRN
  Administered 2018-10-04: 130 mg via INTRAVENOUS

## 2018-10-04 MED ORDER — OXYCODONE HCL 5 MG PO TABS
5.0000 mg | ORAL_TABLET | Freq: Once | ORAL | Status: AC
  Administered 2018-10-04: 5 mg via ORAL

## 2018-10-04 MED ORDER — NAPROXEN 500 MG PO TBEC: 500.0000 mg | DELAYED_RELEASE_TABLET | Freq: Two times a day (BID) | ORAL | 1 refills | Status: AC

## 2018-10-04 MED ORDER — HYDROCODONE-ACETAMINOPHEN 7.5-325 MG PO TABS: 1.0000 | ORAL_TABLET | Freq: Once | ORAL | Status: DC | PRN

## 2018-10-04 MED ORDER — HYDROCODONE-ACETAMINOPHEN 5-325 MG PO TABS: 1.0000 | ORAL_TABLET | ORAL | 0 refills | Status: DC | PRN

## 2018-10-04 MED ORDER — GLYCOPYRROLATE 0.2 MG/ML IJ SOLN
INTRAMUSCULAR | Status: DC | PRN
  Administered 2018-10-04: 0.1 mg via INTRAVENOUS

## 2018-10-04 MED ORDER — FENTANYL CITRATE (PF) 100 MCG/2ML IJ SOLN
INTRAMUSCULAR | Status: DC | PRN
  Administered 2018-10-04 (×2): 25 ug via INTRAVENOUS
  Administered 2018-10-04: 50 ug via INTRAVENOUS

## 2018-10-04 MED ORDER — DEXAMETHASONE SODIUM PHOSPHATE 4 MG/ML IJ SOLN
INTRAMUSCULAR | Status: DC | PRN
  Administered 2018-10-04: 4 mg via INTRAVENOUS

## 2018-10-04 MED ORDER — BUPIVACAINE HCL 0.5 % IJ SOLN
INTRAMUSCULAR | Status: DC | PRN
  Administered 2018-10-04: 3 mL

## 2018-10-04 MED ORDER — FENTANYL CITRATE (PF) 100 MCG/2ML IJ SOLN: 25.0000 ug | INTRAMUSCULAR | Status: DC | PRN

## 2018-10-04 MED ORDER — DEXTROSE 5 % IV SOLN
2000.0000 mg | Freq: Once | INTRAVENOUS | Status: AC
  Administered 2018-10-04: 2000 mg via INTRAVENOUS

## 2018-10-04 MED ORDER — ACETAMINOPHEN 500 MG PO TABS: 1000.0000 mg | ORAL_TABLET | Freq: Three times a day (TID) | ORAL | 2 refills | Status: AC

## 2018-10-04 MED ORDER — LACTATED RINGERS IV SOLN
INTRAVENOUS | Status: DC
  Administered 2018-10-04: 12:00:00 via INTRAVENOUS

## 2018-10-04 MED ORDER — ONDANSETRON HCL 4 MG/2ML IJ SOLN
INTRAMUSCULAR | Status: DC | PRN
  Administered 2018-10-04: 4 mg via INTRAVENOUS

## 2018-10-04 MED ORDER — ONDANSETRON 4 MG PO TBDP: 4.0000 mg | ORAL_TABLET | Freq: Three times a day (TID) | ORAL | 0 refills | Status: DC | PRN

## 2018-10-04 MED ORDER — MIDAZOLAM HCL 5 MG/5ML IJ SOLN
INTRAMUSCULAR | Status: DC | PRN
  Administered 2018-10-04: 2 mg via INTRAVENOUS

## 2018-10-04 MED ORDER — ASPIRIN EC 325 MG PO TBEC: 325.0000 mg | DELAYED_RELEASE_TABLET | Freq: Every day | ORAL | 0 refills | Status: AC

## 2018-10-04 SURGICAL SUPPLY — 39 items
ADAPTER IRRIG TUBE 2 SPIKE SOL (ADAPTER) ×4 IMPLANT
ADPR TBG 2 SPK PMP STRL ASCP (ADAPTER) ×2
BANDAGE ACE 6X5 VEL STRL LF (GAUZE/BANDAGES/DRESSINGS) ×2 IMPLANT
BLADE SURG SZ11 CARB STEEL (BLADE) ×2 IMPLANT
BNDG COHESIVE 4X5 TAN STRL (GAUZE/BANDAGES/DRESSINGS) ×2 IMPLANT
BNDG ESMARK 6X12 TAN STRL LF (GAUZE/BANDAGES/DRESSINGS) ×1 IMPLANT
BRACE KNEE POST OP SHORT (BRACE) ×2 IMPLANT
BUR RADIUS 3.5 (BURR) ×2 IMPLANT
CHLORAPREP W/TINT 26ML (MISCELLANEOUS) ×2 IMPLANT
COOLER POLAR GLACIER W/PUMP (MISCELLANEOUS) ×2 IMPLANT
COVER LIGHT HANDLE UNIVERSAL (MISCELLANEOUS) ×4 IMPLANT
CUFF TOURN SGL QUICK 30 (TOURNIQUET CUFF) ×2
CUFF TRNQT CYL 30X4X21-28X (TOURNIQUET CUFF) IMPLANT
DRAPE IMP U-DRAPE 54X76 (DRAPES) ×2 IMPLANT
DRAPE U-SHAPE 48X52 POLY STRL (PACKS) ×2 IMPLANT
GAUZE SPONGE 4X4 12PLY STRL (GAUZE/BANDAGES/DRESSINGS) ×2 IMPLANT
GLOVE BIO SURGEON STRL SZ7.5 (GLOVE) ×4 IMPLANT
GLOVE BIOGEL PI IND STRL 8 (GLOVE) ×2 IMPLANT
GLOVE BIOGEL PI INDICATOR 8 (GLOVE) ×2
GOWN STRL REUS W/ TWL LRG LVL3 (GOWN DISPOSABLE) ×1 IMPLANT
GOWN STRL REUS W/ TWL XL LVL3 (GOWN DISPOSABLE) ×1 IMPLANT
GOWN STRL REUS W/TWL LRG LVL3 (GOWN DISPOSABLE) ×2
GOWN STRL REUS W/TWL XL LVL3 (GOWN DISPOSABLE) ×2
IV LACTATED RINGER IRRG 3000ML (IV SOLUTION) ×10
IV LR IRRIG 3000ML ARTHROMATIC (IV SOLUTION) ×4 IMPLANT
KIT TURNOVER KIT A (KITS) ×2 IMPLANT
MAT ABSORB  FLUID 56X50 GRAY (MISCELLANEOUS) ×1
MAT ABSORB FLUID 56X50 GRAY (MISCELLANEOUS) ×2 IMPLANT
NEPTUNE MANIFOLD (MISCELLANEOUS) ×2 IMPLANT
PACK ARTHROSCOPY KNEE (MISCELLANEOUS) ×2 IMPLANT
PAD WRAPON POLAR KNEE (MISCELLANEOUS) IMPLANT
PADDING CAST BLEND 6X4 STRL (MISCELLANEOUS) ×1 IMPLANT
PADDING STRL CAST 6IN (MISCELLANEOUS) ×1
SET TUBE SUCT SHAVER OUTFL 24K (TUBING) ×2 IMPLANT
SET TUBE TIP INTRA-ARTICULAR (MISCELLANEOUS) ×2 IMPLANT
SUT ETHILON 3 0 FSLX (SUTURE) ×2 IMPLANT
TUBING ARTHRO INFLOW-ONLY STRL (TUBING) ×2 IMPLANT
WAND WEREWOLF FLOW 90D (MISCELLANEOUS) ×1 IMPLANT
WRAPON POLAR PAD KNEE (MISCELLANEOUS) ×2

## 2018-10-04 NOTE — Anesthesia Procedure Notes (Signed)
Procedure Name: LMA Insertion Date/Time: 10/04/2018 12:27 PM Performed by: Jimmy Picket, CRNA Pre-anesthesia Checklist: Patient identified, Emergency Drugs available, Suction available, Timeout performed and Patient being monitored Patient Re-evaluated:Patient Re-evaluated prior to induction Oxygen Delivery Method: Circle system utilized Preoxygenation: Pre-oxygenation with 100% oxygen Induction Type: IV induction LMA: LMA inserted LMA Size: 4.0 Number of attempts: 1 Placement Confirmation: positive ETCO2 and breath sounds checked- equal and bilateral Tube secured with: Tape

## 2018-10-04 NOTE — Discharge Instructions (Signed)
Arthroscopic Knee Surgery - Partial Meniscectomy   Post-Op Instructions   1. Bracing or crutches: Crutches will be provided at the time of discharge from the surgery center if you do not already have them.   2. Ice: You may be provided with a device [facility]) that allows you to ice the affected area effectively. Otherwise you can ice manually.    3. Driving:  Plan on not driving for at least two weeks. Please note that you are advised NOT to drive while taking narcotic pain medications as you may be impaired and unsafe to drive.   4. Activity: Ankle pumps several times an hour while awake to prevent blood clots. Weight bearing: as tolerated. Use crutches for as needed (usually ~1 week or less) until pain allows you to ambulate without a limp. Bending and straightening the knee is unlimited. Elevate knee above heart level as much as possible for one week. Avoid standing more than 5 minutes (consecutively) for the first week.  Avoid long distance travel for 2 weeks.  5. Medications:  - You have been provided a prescription for narcotic pain medicine. After surgery, take 1-2 narcotic tablets every 4 hours if needed for severe pain.  - You may take up to 3000mg /day of tylenol (acetaminophen). You can take 1000mg  3x/day. Please check your narcotic. If you have acetaminophen in your narcotic (each tablet will be 325mg ), be careful not to exceed a total of 3000mg /day of acetaminophen.  - A prescription for anti-nausea medication will be provided in case the narcotic medicine or anesthesia causes nausea - take 1 tablet every 6 hours only if nauseated.  - Take naproxen 500mg  two times daily WITH food to reduce post-operative knee swelling. DO NOT STOP IBUPROFEN POST-OP UNTIL INSTRUCTED TO DO SO at first post-op office visit (10-14 days after surgery). However, please discontinue if you have any abdominal discomfort after taking this.  - Take enteric coated aspirin 325 mg once daily for 2 weeks to prevent  blood clots.    6. Bandages: The physical therapist should change the bandages at the first post-op appointment. If needed, the dressing supplies have been provided to you.   7. Physical Therapy: 1-2 times per week for 6 weeks. Therapy typically starts on post operative Day 3 or 4. You have been provided an order for physical therapy. The therapist will provide home exercises.   8. Work: May return to full work usually around 2 weeks after 1st post-operative visit. May do light duty/desk job in approximately 1-2 weeks when off of narcotics, pain is well-controlled, and swelling has decreased. Labor intensive jobs may require 4-6 weeks to return.      9. Post-Op Appointments: Your first post-op appointment will be with Dr. Allena Katz in approximately 2 weeks time.    If you find that they have not been scheduled please call the Orthopaedic Appointment front desk at [phone].     General Anesthesia, Adult, Care After This sheet gives you information about how to care for yourself after your procedure. Your health care provider may also give you more specific instructions. If you have problems or questions, contact your health care provider. What can I expect after the procedure? After the procedure, the following side effects are common:  Pain or discomfort at the IV site.  Nausea.  Vomiting.  Sore throat.  Trouble concentrating.  Feeling cold or chills.  Weak or tired.  Sleepiness and fatigue.  Soreness and body aches. These side effects can affect parts of  of the body that were not involved in surgery. °Follow these instructions at home: ° °For at least 24 hours after the procedure: °· Have a responsible adult stay with you. It is important to have someone help care for you until you are awake and alert. °· Rest as needed. °· Do not: °? Participate in activities in which you could fall or become injured. °? Drive. °? Use heavy machinery. °? Drink alcohol. °? Take sleeping pills or  medicines that cause drowsiness. °? Make important decisions or sign legal documents. °? Take care of children on your own. °Eating and drinking °· Follow any instructions from your health care provider about eating or drinking restrictions. °· When you feel hungry, start by eating small amounts of foods that are soft and easy to digest (bland), such as toast. Gradually return to your regular diet. °· Drink enough fluid to keep your urine pale yellow. °· If you vomit, rehydrate by drinking water, juice, or clear broth. °General instructions °· If you have sleep apnea, surgery and certain medicines can increase your risk for breathing problems. Follow instructions from your health care provider about wearing your sleep device: °? Anytime you are sleeping, including during daytime naps. °? While taking prescription pain medicines, sleeping medicines, or medicines that make you drowsy. °· Return to your normal activities as told by your health care provider. Ask your health care provider what activities are safe for you. °· Take over-the-counter and prescription medicines only as told by your health care provider. °· If you smoke, do not smoke without supervision. °· Keep all follow-up visits as told by your health care provider. This is important. °Contact a health care provider if: °· You have nausea or vomiting that does not get better with medicine. °· You cannot eat or drink without vomiting. °· You have pain that does not get better with medicine. °· You are unable to pass urine. °· You develop a skin rash. °· You have a fever. °· You have redness around your IV site that gets worse. °Get help right away if: °· You have difficulty breathing. °· You have chest pain. °· You have blood in your urine or stool, or you vomit blood. °Summary °· After the procedure, it is common to have a sore throat or nausea. It is also common to feel tired. °· Have a responsible adult stay with you for the first 24 hours after general  anesthesia. It is important to have someone help care for you until you are awake and alert. °· When you feel hungry, start by eating small amounts of foods that are soft and easy to digest (bland), such as toast. Gradually return to your regular diet. °· Drink enough fluid to keep your urine pale yellow. °· Return to your normal activities as told by your health care provider. Ask your health care provider what activities are safe for you. °This information is not intended to replace advice given to you by your health care provider. Make sure you discuss any questions you have with your health care provider. °Document Released: 12/26/2000 Document Revised: 05/05/2017 Document Reviewed: 05/05/2017 °Elsevier Interactive Patient Education © 2019 [employer] ° °

## 2018-10-04 NOTE — Anesthesia Preprocedure Evaluation (Addendum)
Anesthesia Evaluation  Patient identified by MRN, date of birth, ID band Patient awake    Airway Mallampati: III  TM Distance: >3 FB Neck ROM: Full    Dental   Pulmonary    Pulmonary exam normal        Cardiovascular Normal cardiovascular exam  Hx of Atypical CP, multiple Caths all clean. Last Echo WNL.    Neuro/Psych Crani for AVM 1996    GI/Hepatic GERD  Medicated and Controlled,  Endo/Other    Renal/GU      Musculoskeletal  (+) Arthritis , Osteoarthritis,    Abdominal   Peds  Hematology   Anesthesia Other Findings   Reproductive/Obstetrics                            Anesthesia Physical Anesthesia Plan  ASA: II  Anesthesia Plan: General   Post-op Pain Management:    Induction: Intravenous  PONV Risk Score and Plan: Ondansetron  Airway Management Planned: LMA  Additional Equipment:   Intra-op Plan:   Post-operative Plan: Extubation in OR  Informed Consent: I have reviewed the patients History and Physical, chart, labs and discussed the procedure including the risks, benefits and alternatives for the proposed anesthesia with the patient or authorized representative who has indicated his/her understanding and acceptance.     Plan Discussed with: CRNA  Anesthesia Plan Comments:         Anesthesia Quick Evaluation

## 2018-10-04 NOTE — Anesthesia Postprocedure Evaluation (Signed)
Anesthesia Post Note  Patient: Joel Galvan  Procedure(s) Performed: KNEE ARTHROSCOPY WITH MENISCAL REPAIR (Left Knee)  Patient location during evaluation: PACU Anesthesia Type: General Level of consciousness: awake and alert Pain management: pain level controlled Vital Signs Assessment: post-procedure vital signs reviewed and stable Respiratory status: spontaneous breathing, nonlabored ventilation, respiratory function stable and patient connected to nasal cannula oxygen Cardiovascular status: blood pressure returned to baseline and stable Postop Assessment: no apparent nausea or vomiting Anesthetic complications: no    Dorene Grebe

## 2018-10-04 NOTE — Op Note (Signed)
Operative Note    SURGERY DATE: 10/04/2018   PRE-OP DIAGNOSIS:  1. Left medial meniscus tear 2. Left medial compartment degenerative changes   POST-OP DIAGNOSIS:  1. Left medial meniscus tear 2. Left medial compartment degenerative changes   PROCEDURES:  1. Left knee arthroscopy, partial medial meniscectomy 2. Left knee chondroplasty of medial compartment   SURGEON: Rosealee AlbeeSunny H. Davey Limas, MD   ANESTHESIA: Gen   ESTIMATED BLOOD LOSS: minimal   TOTAL IV FLUIDS: per anesthesia   INDICATION(S):  Joel Galvan is a 73 y.o. male with signs and symptoms as well as MRI finding of medial meniscus tear.  He has failed significant nonoperative management.  After discussion of risks, benefits, and alternatives to surgery, the patient elected to proceed.   OPERATIVE FINDINGS:    Examination under anesthesia: A careful examination under anesthesia was performed.  Passive range of motion was: Hyperextension: 2.  Extension: 0.  Flexion: 135.  Lachman: normal. Pivot Shift: normal.  Posterior drawer: normal.  Varus stability in full extension: normal.  Varus stability in 30 degrees of flexion: normal.  Valgus stability in full extension: normal.  Valgus stability in 30 degrees of flexion: normal.   Intra-operative findings: A thorough arthroscopic examination of the knee was performed.  The findings are: 1. Suprapatellar pouch: Normal 2. Undersurface of median ridge: Grade 1 degenerative changes 3. Medial patellar facet: Grade 1 degenerative changes 4. Lateral patellar facet: Grade 1 degenerative changes 5. Trochlea: Grade 1-2 degenerative changes centrally 6. Lateral gutter/popliteus tendon: Normal 7. Hoffa's fat pad: Inflamed and synovitic 8. Medial gutter/plica: Normal 9. ACL: Normal 10. PCL: Normal 11. Medial meniscus: Complex tear with parrot-beak tear of the posterior horn extending to the posterior horn/body junction with flipped fragment into the medial gutter.  Additionally there was a  small horizontal tear of the posterior horn as well as significant degeneration of the central aspect of the posterior horn adjacent to the root.  12. Medial compartment cartilage: Grade 2 degenerative changes of the medial femoral condyle diffusely.  Grade 1-2 degenerative changes of the medial tibial plateau 13. Lateral meniscus: Normal 14. Lateral compartment cartilage: Grade 1-2 degenerative changes of the tibial plateau.  Normal lateral femoral condyle   OPERATIVE REPORT:     I identified Joel Galvan in the pre-operative holding area. I marked the operative knee with my initials. I reviewed the risks and benefits of the proposed surgical intervention and the patient (and/or patient's guardian) wished to proceed. The patient was transferred to the operative suite and placed in the supine position with all bony prominences padded.  Anesthesia was administered. Appropriate IV antibiotics were administered prior to incision. The extremity was then prepped and draped in standard fashion. A time out was performed confirming the correct extremity, correct patient, and correct procedure.   Arthroscopy portals were marked. Local anesthetic was injected to the planned portal sites. The anterolateral portal was established with an 11 blade.      The arthroscope was placed in the anterolateral portal and then into the suprapatellar pouch.  A diagnostic knee scope was completed with the above findings. The medial meniscus tear was identified.   Next the medial portal was established under needle localization. The MCL was pie-crusted to improve visualization of the posterior horn. The meniscal tear was debrided using an arthroscopic biter and an oscillating shaver until the meniscus had stable borders.  After debridement, there was approximately a 1-2 mm rim of meniscus at the posterior horn/body junction corresponding to the  apex of the parrot-beak tear.  After debridement of the horizontal component of  the posterior horn, there was approximately 6 mm of posterior horn medial meniscus left.  A chondroplasty was performed of the medial compartment such that there were stable cartilage edges without any loose fragments of cartilage. Areas of significant synovitis and inflammation about Hoffa's fat pad were coagulated with an ArthroCare wand.  Arthroscopic fluid was removed from the joint.   The portals were closed with 3-0 Nylon suture. Sterile dressings included Xeroform, 4x4s, Sof-Rol, and Bias wrap. A Polarcare was placed.  The patient was then awakened and taken to the PACU hemodynamically stable without complication.     POSTOPERATIVE PLAN: The patient will be discharged home today once they meet PACU criteria. Aspirin 325 mg daily was prescribed for 2 weeks for DVT prophylaxis.  Physical therapy will start on POD#3-4. Weight-bearing as tolerated. Follow up in 2 weeks per protocol.

## 2018-10-04 NOTE — Transfer of Care (Signed)
Immediate Anesthesia Transfer of Care Note  Patient: Joel Galvan  Procedure(s) Performed: KNEE ARTHROSCOPY WITH MENISCAL REPAIR (Left Knee)  Patient Location: PACU  Anesthesia Type: General  Level of Consciousness: awake, alert  and patient cooperative  Airway and Oxygen Therapy: Patient Spontanous Breathing and Patient connected to supplemental oxygen  Post-op Assessment: Post-op Vital signs reviewed, Patient's Cardiovascular Status Stable, Respiratory Function Stable, Patent Airway and No signs of Nausea or vomiting  Post-op Vital Signs: Reviewed and stable  Complications: No apparent anesthesia complications

## 2018-10-04 NOTE — H&P (Signed)
Paper H&P to be scanned into permanent record. H&P reviewed. No significant changes noted.  

## 2019-02-18 ENCOUNTER — Telehealth: Payer: Self-pay

## 2019-02-18 NOTE — Telephone Encounter (Signed)
Call from recall pool for e visit, scheduled visit for this Thursday with Ward Givens, NP.  Reviewed e visit procedure. Pt verbalized understanding and had no further questions at this time.  Reviewed consent.    CONSENT FOR TELE-HEALTH VISIT - PLEASE REVIEW  I hereby voluntarily request, consent and authorize [facility] and its employed or contracted physicians, physician assistants, nurse practitioners or other licensed health care professionals (the Practitioner), to provide me with telemedicine health care services (the "Services") as deemed necessary by the treating Practitioner. I acknowledge and consent to receive the Services by the Practitioner via telemedicine. I understand that the telemedicine visit will involve communicating with the Practitioner through live audiovisual communication technology and the disclosure of certain medical information by electronic transmission. I acknowledge that I have been given the opportunity to request an in-person assessment or other available alternative prior to the telemedicine visit and am voluntarily participating in the telemedicine visit.  Pt verbally agreed.   I understand that I have the right to withhold or withdraw my consent to the use of telemedicine in the course of my care at any time, without affecting my right to future care or treatment, and that the Practitioner or I may terminate the telemedicine visit at any time. I understand that I have the right to inspect all information obtained and/or recorded in the course of the telemedicine visit and may receive copies of available information for a reasonable fee.  I understand that some of the potential risks of receiving the Services via telemedicine include:  Marland Kitchen Delay or interruption in medical evaluation due to technological equipment failure or disruption; . Information transmitted may not be sufficient (e.g. poor resolution of images) to allow for appropriate medical decision making by  the Practitioner; and/or  . In rare instances, security protocols could fail, causing a breach of personal health information.  Furthermore, I acknowledge that it is my responsibility to provide information about my medical history, conditions and care that is complete and accurate to the best of my ability. I acknowledge that Practitioner's advice, recommendations, and/or decision may be based on factors not within their control, such as incomplete or inaccurate data provided by me or distortions of diagnostic images or specimens that may result from electronic transmissions. I understand that the practice of medicine is not an exact science and that Practitioner makes no warranties or guarantees regarding treatment outcomes. I acknowledge that I will receive a copy of this consent concurrently upon execution via email to the email address I last provided but may also request a printed copy by calling the office of [facility].    I understand that my insurance will be billed for this visit.   I have read or had this consent read to me. . I understand the contents of this consent, which adequately explains the benefits and risks of the Services being provided via telemedicine.  . I have been provided ample opportunity to ask questions regarding this consent and the Services and have had my questions answered to my satisfaction. . I give my informed consent for the services to be provided through the use of telemedicine in my medical care  By participating in this telemedicine visit I agree to the above.   Pt verbally agreed.

## 2019-02-20 NOTE — Progress Notes (Signed)
Virtual Visit via Video Note   This visit type was conducted due to national recommendations for restrictions regarding the COVID-19 Pandemic (e.g. social distancing) in an effort to limit this patient's exposure and mitigate transmission in our community.  Due to his co-morbid illnesses, this patient is at least at moderate risk for complications without adequate follow up.  This format is felt to be most appropriate for this patient at this time.  All issues noted in this document were discussed and addressed.  A limited physical exam was performed with this format.  Please refer to the patient's chart for his consent to telehealth for [facility]. Evaluation Performed:  Follow-up visit  This visit type was conducted due to national recommendations for restrictions regarding the COVID-19 Pandemic (e.g. social distancing).  This format is felt to be most appropriate for this patient at this time.  All issues noted in this document were discussed and addressed.  No physical exam was performed (except for noted visual exam findings with Video Visits).  Please refer to the patient's chart (MyChart message for video visits and phone note for telephone visits) for the patient's consent to telehealth for [facility]. _____________   Date:  02/21/2019   Patient ID:  Martyn EhrichRobert F Fosco, DOB 01/12/1946, MRN [id number] Patient Location:  Home Provider location:   Office  Primary Care Provider:  Danella PentonMiller, Mark F, MD Primary Cardiologist:  Lorine BearsMuhammad Arida, MD  Chief Complaint    73 year old male with a history of atypical chest pain, nonobstructive CAD, multiple negative noninvasive cardiovascular studies, and hyperlipidemia, who presents for follow-up related to chest pain.  Past Medical History    Past Medical History:  Diagnosis Date  . Bursitis   . Chest pain, atypical    a. 03/2006 Cath: EF 55%, mild luminal irregularities; b. 01/2009 Lexi MV: EF 64%, fixed inferior defect w normal wall  motion, likely diaphragmatic attenuation. No ischemia;  c. 2014 Ex MV: no ischemia-->c/p improved w/ PPI;  d. 01/2015 ETT: Ex time 9 mins, Max HR 141, no st/t changes; e. 01/2017 Ex MV: No isch, EF 46% (artifact - no by echo).  . CNS (central nervous system disease)    AVM s/p surgery in 1996  . History of echocardiogram    a. 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 03/2017 Echo: EF 55-60%, no rwma, Asc Ao 3.76mm, nl RV fxn, mildly dil RA.  Marland Kitchen. Hyperlipidemia   . Hyperuricemia   . Osteoarthritis    hands and feet  . Plantar fasciitis   . Sinusitis   . Syncope and collapse    history  . Tendonitis    Past Surgical History:  Procedure Laterality Date  . APPENDECTOMY  1958  . BALLOON SINUPLASTY  11/29/2017   Dr Wyn ForsterJuengel's office  . CARDIAC CATHETERIZATION  2007   cone  . CRANIOTOMY  1996   for AVM  . FRACTURE SURGERY Left    arm  . HEMORRHOID SURGERY    . KNEE ARTHROSCOPY WITH MENISCAL REPAIR Left 10/04/2018   Procedure: KNEE ARTHROSCOPY WITH MENISCAL REPAIR;  Surgeon: Signa KellPatel, Sunny, MD;  [facility];  Service: Orthopedics;  Laterality: Left;  . 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 ROTATOR CUFF REPAIR Right 10/08/2014  . TONSILLECTOMY  1966    Allergies  Allergies  Allergen Reactions  .  Methocarbamol Hives  . Prednisone Other (See Comments)    Necrosis in hip joints Necrosis in hip joints     History of Present Illness    Joel Galvan is a 73 y.o. male who presents via audio/video conferencing for a telehealth visit today.  As above, he has a long history of atypical chest pain status post cardiac catheterization in 2007 revealing nonobstructive CAD.  He subsequently underwent noninvasive nuclear stress testing in 2010, 2014, 2016, and most recently in May 2018.  Each of these tests were  nonischemic.  In June 2018, he underwent an echocardiogram which showed normal LV function.  He was last seen in clinic in February 2019, following a visit to the [facility] ED after a period of prolonged chest and back pain.  ED work-up was unremarkable and he was feeling better after taking PPI therapy as prescribed.  We did discuss possibly pursuing cardiac CT angiography at some point in the future.  Since his last visit, he has done quite well.  He exercises most days of the week without symptoms or limitations.  He remains on daily PPI therapy and notes that this has made a significant difference in chest pain symptoms.  If he forgets to take his PPI in the morning, he is much more likely to have symptoms after dinner.  He does not have any exertional symptoms and denies PND, orthopnea, dizziness, syncope, edema, or early satiety.  The patient does not have symptoms concerning for COVID-19 infection (fever, chills, cough, or new shortness of breath).   Home Medications    Prior to Admission medications   Medication Sig Start Date End Date Taking? Authorizing Provider  acetaminophen (TYLENOL) 500 MG tablet Take 2 tablets (1,000 mg total) by mouth every 8 (eight) hours. 10/04/18 10/04/19  Signa Kell, MD  atorvastatin (LIPITOR) 10 MG tablet Take 5 mg by mouth as directed.     [provider]  benzonatate (TESSALON) 100 MG capsule Take by mouth as needed for cough.    [provider]  ezetimibe (ZETIA) 10 MG tablet Take 10 mg by mouth daily.      [provider]  HYDROcodone-acetaminophen (NORCO) 5-325 MG tablet Take 1-2 tablets by mouth every 4 (four) hours as needed for moderate pain or severe pain. 10/04/18   Signa Kell, MD  Multiple Vitamin (MULTIVITAMIN) tablet Take 1 tablet by mouth daily.    [provider]  ondansetron (ZOFRAN ODT) 4 MG disintegrating tablet Take 1 tablet (4 mg total) by mouth every 8 (eight) hours as needed for nausea or vomiting. 10/04/18    Signa Kell, MD  pantoprazole (PROTONIX) 40 MG tablet TAKE 1 TABLET (40 MG TOTAL) BY MOUTH DAILY. 01/17/17   Iran Ouch, MD  tadalafil (CIALIS) 5 MG tablet Take 5 mg by mouth daily as needed for erectile dysfunction.    [provider]  triamcinolone (NASACORT ALLERGY 24HR) 55 MCG/ACT AERO nasal inhaler Place 2 sprays into the nose daily as needed.    [provider]    Review of Systems    Occasional GERD-like symptoms which have improved significantly with PPI therapy.  He denies chest pain, palpitations, dyspnea, pnd, orthopnea, n, v, dizziness, syncope, edema, weight gain, or early satiety.  All other systems reviewed and are otherwise negative except as noted above.  Physical Exam    Vital Signs:  Ht 5\' 8"  (1.727 m)   Wt 203 lb (92.1 kg)   BMI 30.87 kg/m  *he does not  routinely check bp @ home.  Well nourished, well developed male in no acute distress.  Awake alert and oriented x3.  Respirations regular unlabored.  HEENT is grossly normal.   Accessory Clinical Findings    Labs from September 2019  Total cholesterol 168, triglycerides 80, HDL 56.5, LDL 96  Sodium 141, potassium 4.4, chloride 103, CO2 29, BUN 16, creatinine 1.2, glucose 102 Calcium 9.5, magnesium 2.0, albumin 4.4 total bilirubin 1.5, alkaline phosphatase 57, AST 25, ALT 23 Hemoglobin 16.0, hematocrit 47.8, WBC 6.4, platelets 213  Assessment & Plan    1.  Atypical chest pain/nonobstructive CAD: Patient has been doing well over the past year with symptoms much improved since taking daily PPI therapy.  He remains on statin and Zetia therapy.  No aspirin secondary to prior history of nosebleeds on aspirin.  2.  GERD: Much improved now that he is taking PPI daily.  3.  Hyperlipidemia: He remains on Lipitor and Zetia with an LDL of 96 last September.  4.  Disposition: Follow-up with Dr. Kirke Corin in 1 year or sooner if necessary.  COVID-19 Education: The signs and symptoms of COVID-19 were  discussed with the patient and how to seek care for testing (follow up with PCP or arrange E-visit).  The importance of social distancing was discussed today.  Patient Risk:   After full review of this patient's history and clinical status, I feel that he is at least moderate risk for cardiac complications at this time, thus necessitating a telehealth visit sooner than our first available in office visit.  Time:   Today, I have spent 20 minutes with the patient with telehealth technology discussing medical history, symptoms, and management plan.    Nicolasa Ducking, NP 02/21/2019, 9:40 AM

## 2019-02-21 ENCOUNTER — Encounter: Payer: Self-pay | Admitting: Nurse Practitioner

## 2019-02-21 ENCOUNTER — Telehealth (INDEPENDENT_AMBULATORY_CARE_PROVIDER_SITE_OTHER): Payer: Medicare Other | Admitting: Nurse Practitioner

## 2019-02-21 ENCOUNTER — Other Ambulatory Visit: Payer: Self-pay

## 2019-02-21 VITALS — Ht 68.0 in | Wt 203.0 lb

## 2019-02-21 DIAGNOSIS — K219 Gastro-esophageal reflux disease without esophagitis: Secondary | ICD-10-CM

## 2019-02-21 DIAGNOSIS — R0789 Other chest pain: Secondary | ICD-10-CM

## 2019-02-21 DIAGNOSIS — E782 Mixed hyperlipidemia: Secondary | ICD-10-CM

## 2019-02-21 NOTE — Patient Instructions (Signed)
It was a pleasure to speak with you on the phone today! Thank you for allowing us to continue taking care of your Heartcare needs during this time.   Feel free to call as needed for questions and concerns related to your cardiac needs.   Medication Instructions:  Your physician recommends that you continue on your current medications as directed. Please refer to the Current Medication list given to you today.  If you need a refill on your cardiac medications before your next appointment, please call your pharmacy.   Lab work: None ordered  If you have labs (blood work) drawn today and your tests are completely normal, you will receive your results only by: . MyChart Message (if you have MyChart) OR . A paper copy in the mail If you have any lab test that is abnormal or we need to change your treatment, we will call you to review the results.  Testing/Procedures: None ordered   Follow-Up: At [facility], you and your health needs are our priority.  As part of our continuing mission to provide you with exceptional heart care, we have created designated Provider Care Teams.  These Care Teams include your primary Cardiologist (physician) and Advanced Practice Providers (APPs -  Physician Assistants and Nurse Practitioners) who all work together to provide you with the care you need, when you need it. You will need a follow up appointment in 1 years.  Please call our office 2 months in advance to schedule this appointment.  You may see Muhammad Arida, MD or Christopher Berge, NP.  

## 2019-05-18 IMAGING — CR DG CHEST 2V
2 series · 2 of 2 positions shown · non-contrast
Comparison: 01/19/2015

CLINICAL DATA: Chest pain

EXAM:
CHEST  2 VIEW

[chest pa]
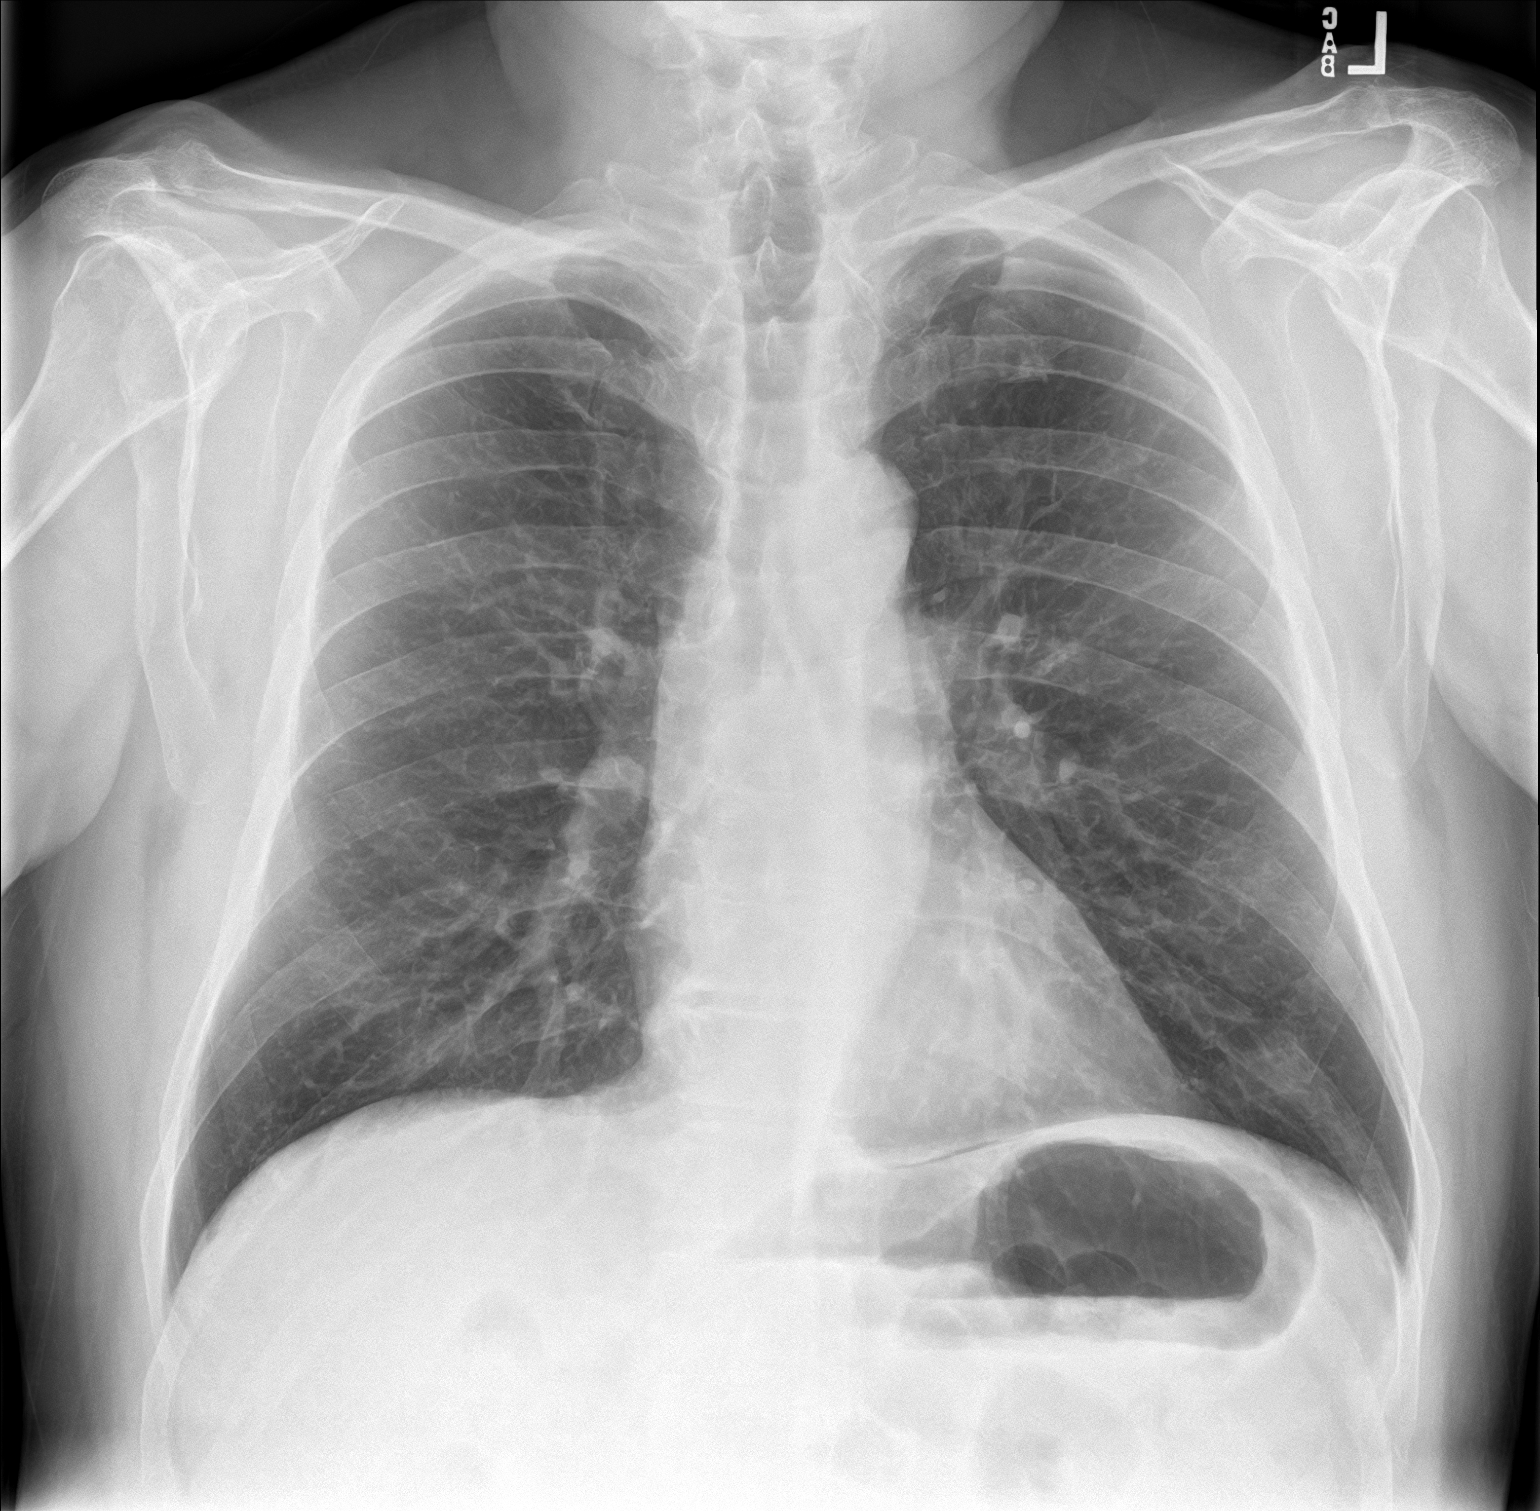

[chest lat]
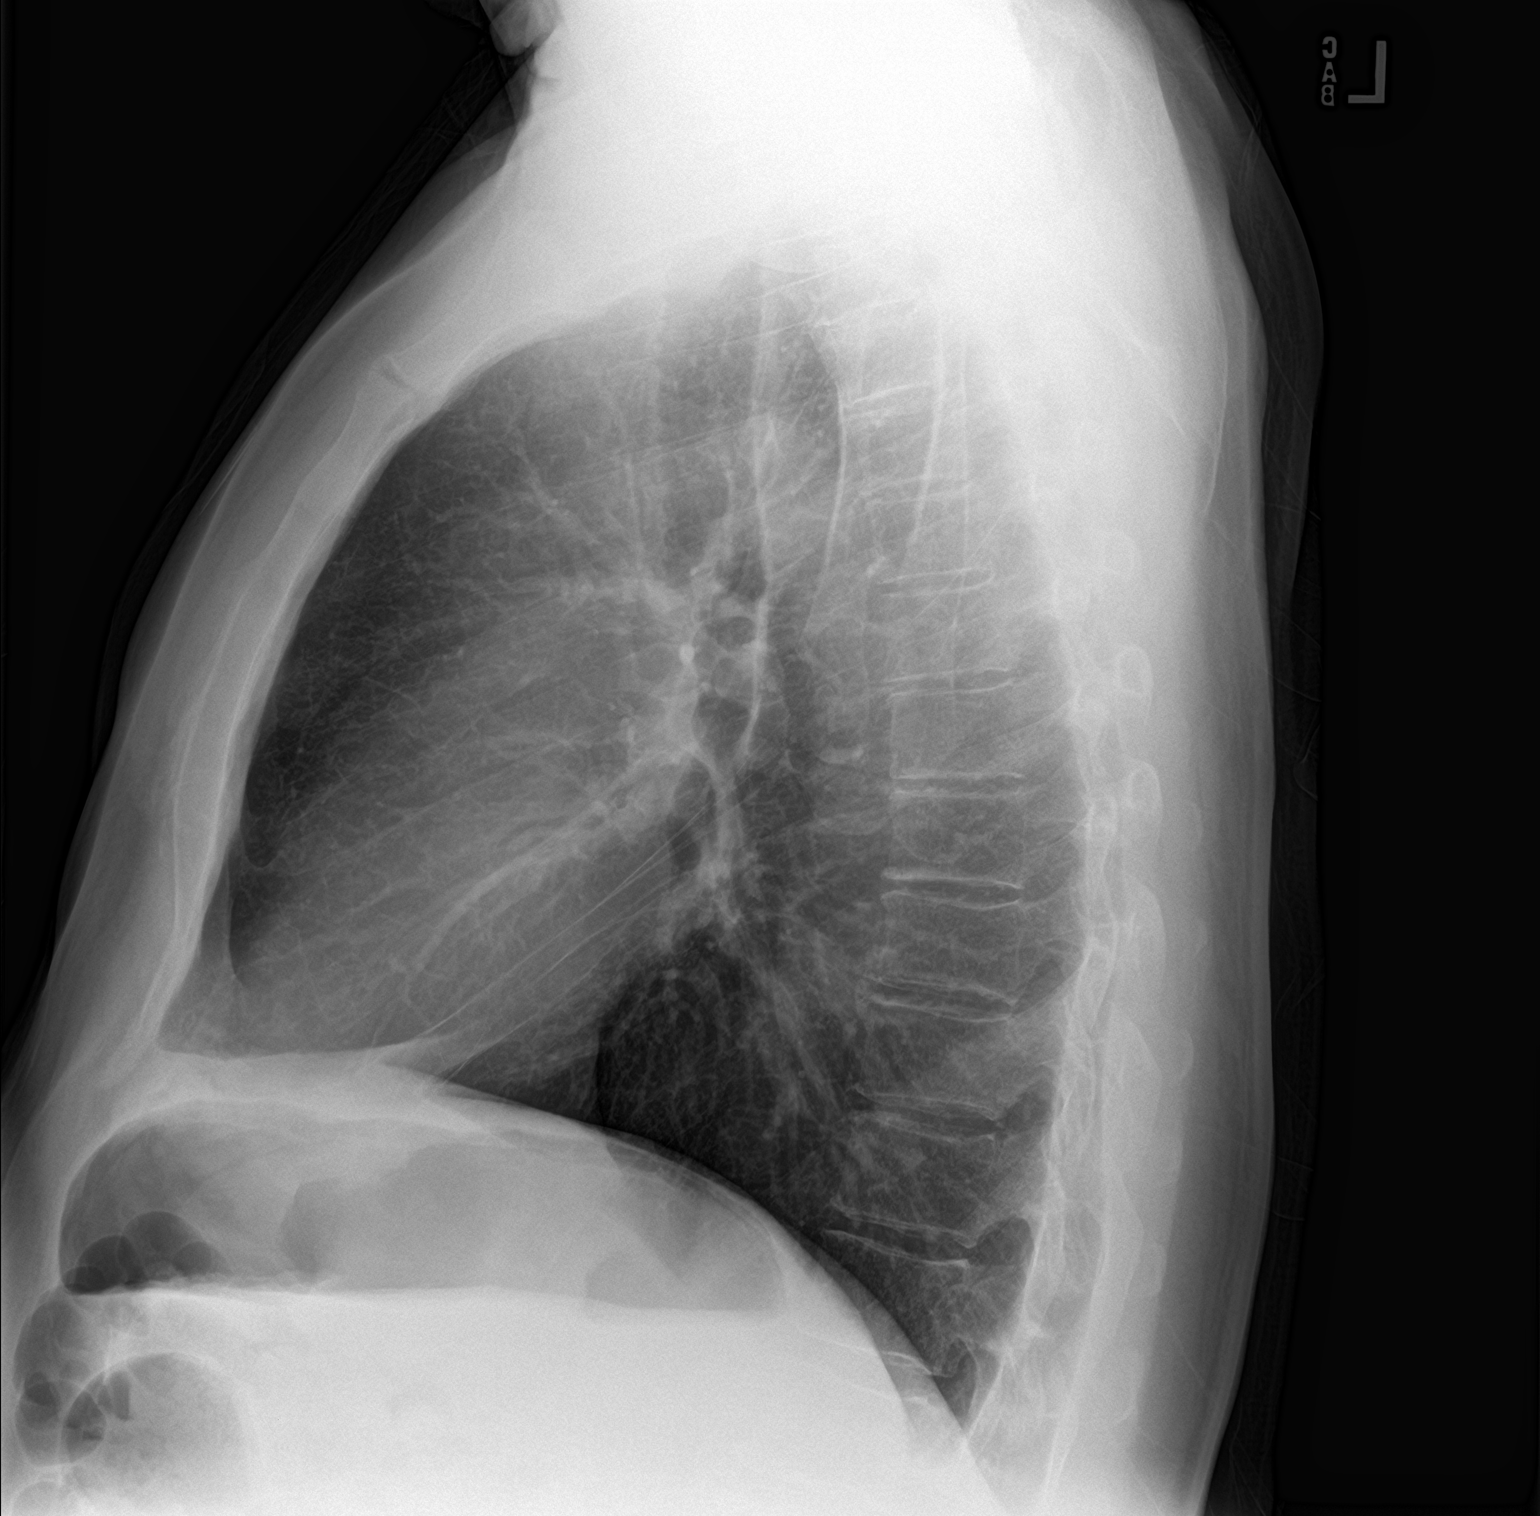

[2 of 2 positions shown; findings below may reference images not displayed]

FINDINGS: Lungs are clear.  No pleural effusion or pneumothorax.

The heart is normal in size.

Visualized osseous structures are within normal limits.
IMPRESSION: Normal chest radiographs.

## 2019-08-14 ENCOUNTER — Other Ambulatory Visit: Payer: Self-pay | Admitting: Orthopedic Surgery

## 2019-08-21 ENCOUNTER — Other Ambulatory Visit: Payer: Self-pay

## 2019-08-21 ENCOUNTER — Encounter
Admission: RE | Admit: 2019-08-21 | Discharge: 2019-08-21 | Disposition: A | Discharge status: Home / Self Care | Payer: Medicare Other | Source: Ambulatory Visit | Attending: Orthopedic Surgery | Admitting: Orthopedic Surgery

## 2019-08-21 DIAGNOSIS — R001 Bradycardia, unspecified: Secondary | ICD-10-CM | POA: Insufficient documentation

## 2019-08-21 DIAGNOSIS — Z01818 Encounter for other preprocedural examination: Secondary | ICD-10-CM | POA: Insufficient documentation

## 2019-08-21 HISTORY — DX: Gastro-esophageal reflux disease without esophagitis: K21.9

## 2019-08-21 LAB — BASIC METABOLIC PANEL
Anion gap: 14 (ref 5–15)
BUN: 16 mg/dL (ref 8–23)
CO2: 25 mmol/L (ref 22–32)
Calcium: 9 mg/dL (ref 8.9–10.3)
Chloride: 100 mmol/L (ref 98–111)
Creatinine, Ser: 0.96 mg/dL (ref 0.61–1.24)
GFR calc Af Amer: >60 mL/min (ref >60)
GFR calc non Af Amer: >60 mL/min (ref >60)
Glucose, Bld: 102 mg/dL — ABNORMAL HIGH (ref 70–99)
Potassium: 4.4 mmol/L (ref 3.5–5.1)
Sodium: 139 mmol/L (ref 135–145)

## 2019-08-21 LAB — CBC WITH DIFFERENTIAL/PLATELET
Abs Immature Granulocytes: 0.02 10*3/uL (ref 0.00–0.07)
Basophils Absolute: 0.1 10*3/uL (ref 0.0–0.1)
Basophils Relative: 1 %
Eosinophils Absolute: 0.2 10*3/uL (ref 0.0–0.5)
Eosinophils Relative: 3 %
HCT: 42.6 % (ref 39.0–52.0)
Hemoglobin: 14.7 g/dL (ref 13.0–17.0)
Immature Granulocytes: 0 %
Lymphocytes Relative: 30 %
Lymphs Abs: 1.6 10*3/uL (ref 0.7–4.0)
MCH: 29.6 pg (ref 26.0–34.0)
MCHC: 34.5 g/dL (ref 30.0–36.0)
MCV: 85.9 fL (ref 80.0–100.0)
Monocytes Absolute: 0.5 10*3/uL (ref 0.1–1.0)
Monocytes Relative: 9 %
Neutro Abs: 2.9 10*3/uL (ref 1.7–7.7)
Neutrophils Relative %: 57 %
Platelets: 212 10*3/uL (ref 150–400)
RBC: 4.96 MIL/uL (ref 4.22–5.81)
RDW: 12.6 % (ref 11.5–15.5)
WBC: 5.2 10*3/uL (ref 4.0–10.5)
nRBC: 0 % (ref 0.0–0.2)

## 2019-08-21 LAB — TYPE AND SCREEN
ABO/RH(D): A POS
Antibody Screen: NEGATIVE

## 2019-08-21 LAB — URINALYSIS, ROUTINE W REFLEX MICROSCOPIC
Bilirubin Urine: NEGATIVE
Glucose, UA: NEGATIVE mg/dL
Hgb urine dipstick: NEGATIVE
Ketones, ur: NEGATIVE mg/dL
Leukocytes,Ua: NEGATIVE
Nitrite: NEGATIVE
Protein, ur: NEGATIVE mg/dL
Specific Gravity, Urine: 1.012 (ref 1.005–1.030)
pH: 5 (ref 5.0–8.0)

## 2019-08-21 LAB — SURGICAL PCR SCREEN
MRSA, PCR: NEGATIVE
Staphylococcus aureus: NEGATIVE

## 2019-08-21 LAB — APTT: aPTT: 32 seconds (ref 24–36)

## 2019-08-21 LAB — PROTIME-INR
INR: 1 (ref 0.8–1.2)
Prothrombin Time: 13 seconds (ref 11.4–15.2)

## 2019-08-21 NOTE — Patient Instructions (Signed)
Your COVID test is scheduled on Friday 08/23/2019. Drive up any time 7:42-59:56LO in front of [facility] and remain in your vehicle. Your procedure is scheduled on: Tuesday 08/27/2019 Report to Same Day Surgery 2nd floor [facility] Entrance-take elevator on left to 2nd floor.  Check in with surgery information desk.) To find out your arrival time, call [phone] 1:00-3:00 PM on Monday 08/26/2019  Remember: Instructions that are not followed completely may result in serious medical risk, up to and including death, or upon the discretion of your surgeon and anesthesiologist your surgery may need to be rescheduled.    __x__ 1. Do not eat food (including mints, candies, chewing gum) after midnight the night before your procedure. You may drink clear liquids up to 2 hours before you are scheduled to arrive at the hospital for your procedure.  Do not drink anything within 2 hours of your scheduled arrival to the hospital.  Approved clear liquids:  --Water or Apple juice without pulp  --Clear carbohydrate beverage such as Gatorade or Powerade  --Black Coffee or Clear Tea (No milk, no creamers, do not add anything to the coffee or tea)  Finish your provided Ensure drink 2 hours before your scheduled arrival time.    __x__ 2. No Alcohol or smoking for 24 hours before or after surgery.   __x__ 3. Notify your doctor if there is any change in your medical condition (cold, fever, infections).   __x__ 4. On the morning of surgery brush your teeth with toothpaste and water.  You may rinse your mouth with mouthwash if you wish.  Do not swallow any toothpaste or mouthwash.  Please read over the following fact sheets that you were given:   [facility] Preparing for Surgery and/or MRSA Information    __x__ Use CHG Soap as directed on instruction sheet.   Do not wear jewelry on the day of surgery.  Do not wear lotions, powders, deodorant, or perfumes.   Do not shave below the  face/neck 48 hours prior to surgery.   Do not bring valuables to the hospital.    [facility] is not responsible for any belongings or valuables.               Contacts, dentures or bridgework may not be worn into surgery.  For patients admitted to the hospital, discharge time is determined by your treatment team.  __x__ Take these medicines on the morning of surgery with a SMALL SIP OF WATER:  1. Ezetimibe (Zetia)  2. Pantoprazole (Protonix)  3. Tylenol if needed for pain  __x__ Follow recommendations from Cardiologist, Pulmonologist or PCP regarding stopping Aspirin, Coumadin, Plavix, Eliquis, Effient, Pradaxa, and Pletal.  __x__ STARTING TODAY: Stop Anti-inflammatories such as Advil, Ibuprofen, Motrin, Aleve, Naproxen, Naprosyn, BC/Goodies powders or aspirin products. You may continue to take Tylenol.   __x__ STARTING TODAY: Stop supplements until after surgery.  Please bring a copy of your Medical Advance Directive with you on the surgery.

## 2019-08-22 LAB — URINE CULTURE: Culture: >=100000 — AB

## 2019-08-23 ENCOUNTER — Other Ambulatory Visit
Admission: RE | Admit: 2019-08-23 | Discharge: 2019-08-23 | Disposition: A | Discharge status: Home / Self Care | Payer: Medicare Other | Source: Ambulatory Visit | Attending: Orthopedic Surgery | Admitting: Orthopedic Surgery

## 2019-08-23 ENCOUNTER — Other Ambulatory Visit: Payer: Self-pay

## 2019-08-23 DIAGNOSIS — Z20828 Contact with and (suspected) exposure to other viral communicable diseases: Secondary | ICD-10-CM | POA: Insufficient documentation

## 2019-08-23 DIAGNOSIS — Z01812 Encounter for preprocedural laboratory examination: Secondary | ICD-10-CM | POA: Insufficient documentation

## 2019-08-23 LAB — SARS CORONAVIRUS 2 (TAT 6-24 HRS): SARS Coronavirus 2: NEGATIVE

## 2019-08-27 ENCOUNTER — Inpatient Hospital Stay: Payer: Medicare Other | Admitting: Anesthesiology

## 2019-08-27 ENCOUNTER — Encounter
Admission: RE | Disposition: A | Discharge status: Home Health | Payer: Self-pay | Source: Home / Self Care | Attending: Orthopedic Surgery

## 2019-08-27 ENCOUNTER — Inpatient Hospital Stay: Payer: Medicare Other

## 2019-08-27 ENCOUNTER — Inpatient Hospital Stay
Admission: RE | Admit: 2019-08-27 | Discharge: 2019-08-29 | DRG: 468 | Disposition: A | Discharge status: Home Health | Payer: Medicare Other | Attending: Orthopedic Surgery | Admitting: Orthopedic Surgery

## 2019-08-27 ENCOUNTER — Other Ambulatory Visit: Payer: Self-pay

## 2019-08-27 ENCOUNTER — Encounter: Payer: Self-pay | Admitting: *Deleted

## 2019-08-27 DIAGNOSIS — I959 Hypotension, unspecified: Secondary | ICD-10-CM | POA: Diagnosis present

## 2019-08-27 DIAGNOSIS — Y832 Surgical operation with anastomosis, bypass or graft as the cause of abnormal reaction of the patient, or of later complication, without mention of misadventure at the time of the procedure: Secondary | ICD-10-CM | POA: Diagnosis present

## 2019-08-27 DIAGNOSIS — Z419 Encounter for procedure for purposes other than remedying health state, unspecified: Secondary | ICD-10-CM

## 2019-08-27 DIAGNOSIS — Z7982 Long term (current) use of aspirin: Secondary | ICD-10-CM | POA: Diagnosis not present

## 2019-08-27 DIAGNOSIS — Z96649 Presence of unspecified artificial hip joint: Secondary | ICD-10-CM

## 2019-08-27 DIAGNOSIS — Z79899 Other long term (current) drug therapy: Secondary | ICD-10-CM | POA: Diagnosis not present

## 2019-08-27 DIAGNOSIS — G8918 Other acute postprocedural pain: Secondary | ICD-10-CM

## 2019-08-27 DIAGNOSIS — E669 Obesity, unspecified: Secondary | ICD-10-CM | POA: Diagnosis present

## 2019-08-27 DIAGNOSIS — Z20828 Contact with and (suspected) exposure to other viral communicable diseases: Secondary | ICD-10-CM | POA: Diagnosis present

## 2019-08-27 DIAGNOSIS — E785 Hyperlipidemia, unspecified: Secondary | ICD-10-CM | POA: Diagnosis present

## 2019-08-27 DIAGNOSIS — Z791 Long term (current) use of non-steroidal anti-inflammatories (NSAID): Secondary | ICD-10-CM

## 2019-08-27 DIAGNOSIS — Z6830 Body mass index (BMI) 30.0-30.9, adult: Secondary | ICD-10-CM | POA: Diagnosis not present

## 2019-08-27 DIAGNOSIS — K219 Gastro-esophageal reflux disease without esophagitis: Secondary | ICD-10-CM | POA: Diagnosis present

## 2019-08-27 DIAGNOSIS — T84030A Mechanical loosening of internal right hip prosthetic joint, initial encounter: Principal | ICD-10-CM | POA: Diagnosis present

## 2019-08-27 DIAGNOSIS — M25551 Pain in right hip: Secondary | ICD-10-CM | POA: Diagnosis present

## 2019-08-27 HISTORY — PX: TOTAL HIP REVISION: SHX763

## 2019-08-27 LAB — CREATININE, SERUM
Creatinine, Ser: 1.11 mg/dL (ref 0.61–1.24)
GFR calc Af Amer: >60 mL/min (ref >60)
GFR calc non Af Amer: >60 mL/min (ref >60)

## 2019-08-27 LAB — ABO/RH: ABO/RH(D): A POS

## 2019-08-27 LAB — CBC
HCT: 41.9 % (ref 39.0–52.0)
Hemoglobin: 14.1 g/dL (ref 13.0–17.0)
MCH: 30.2 pg (ref 26.0–34.0)
MCHC: 33.7 g/dL (ref 30.0–36.0)
MCV: 89.7 fL (ref 80.0–100.0)
Platelets: 192 10*3/uL (ref 150–400)
RBC: 4.67 MIL/uL (ref 4.22–5.81)
RDW: 12.5 % (ref 11.5–15.5)
WBC: 10.7 10*3/uL — ABNORMAL HIGH (ref 4.0–10.5)
nRBC: 0 % (ref 0.0–0.2)

## 2019-08-27 SURGERY — TOTAL HIP REVISION
Anesthesia: Spinal | Site: Hip | Laterality: Right

## 2019-08-27 MED ORDER — CEFAZOLIN SODIUM-DEXTROSE 2-4 GM/100ML-% IV SOLN
INTRAVENOUS | Status: AC
  Filled 2019-08-27: qty 100

## 2019-08-27 MED ORDER — PHENOL 1.4 % MT LIQD
1.0000 | OROMUCOSAL | Status: DC | PRN
  Filled 2019-08-27: qty 177

## 2019-08-27 MED ORDER — TRIAMCINOLONE ACETONIDE 55 MCG/ACT NA AERO
2.0000 | INHALATION_SPRAY | Freq: Every day | NASAL | Status: DC | PRN
  Filled 2019-08-27: qty 21.6

## 2019-08-27 MED ORDER — MIDAZOLAM HCL 5 MG/5ML IJ SOLN
INTRAMUSCULAR | Status: DC | PRN
  Administered 2019-08-27: 2 mg via INTRAVENOUS

## 2019-08-27 MED ORDER — ATORVASTATIN CALCIUM 10 MG PO TABS
5.0000 mg | ORAL_TABLET | ORAL | Status: DC
  Administered 2019-08-28: 5 mg via ORAL
  Filled 2019-08-27 (×2): qty 1

## 2019-08-27 MED ORDER — EPHEDRINE SULFATE 50 MG/ML IJ SOLN
INTRAMUSCULAR | Status: DC | PRN
  Administered 2019-08-27 (×2): 7.5 mg via INTRAVENOUS
  Administered 2019-08-27 (×2): 5 mg via INTRAVENOUS
  Administered 2019-08-27: 7.5 mg via INTRAVENOUS

## 2019-08-27 MED ORDER — NEOMYCIN-POLYMYXIN B GU 40-200000 IR SOLN
Status: DC | PRN
  Administered 2019-08-27: 12 mL
  Administered 2019-08-27: 4 mL

## 2019-08-27 MED ORDER — ONDANSETRON HCL 4 MG/2ML IJ SOLN: 4.0000 mg | Freq: Four times a day (QID) | INTRAMUSCULAR | Status: DC | PRN

## 2019-08-27 MED ORDER — MAGNESIUM CITRATE PO SOLN
1.0000 | Freq: Once | ORAL | Status: DC | PRN
  Filled 2019-08-27: qty 296

## 2019-08-27 MED ORDER — DOCUSATE SODIUM 100 MG PO CAPS
100.0000 mg | ORAL_CAPSULE | Freq: Two times a day (BID) | ORAL | Status: DC
  Administered 2019-08-27 – 2019-08-29 (×4): 100 mg via ORAL
  Filled 2019-08-27 (×4): qty 1

## 2019-08-27 MED ORDER — METOCLOPRAMIDE HCL 10 MG PO TABS: 5.0000 mg | ORAL_TABLET | Freq: Three times a day (TID) | ORAL | Status: DC | PRN

## 2019-08-27 MED ORDER — METOCLOPRAMIDE HCL 5 MG/ML IJ SOLN: 5.0000 mg | Freq: Three times a day (TID) | INTRAMUSCULAR | Status: DC | PRN

## 2019-08-27 MED ORDER — MAGNESIUM HYDROXIDE 400 MG/5ML PO SUSP
30.0000 mL | Freq: Every day | ORAL | Status: DC | PRN
  Administered 2019-08-29: 30 mL via ORAL
  Filled 2019-08-27 (×2): qty 30

## 2019-08-27 MED ORDER — LACTATED RINGERS IV SOLN
INTRAVENOUS | Status: DC
  Administered 2019-08-27: 10:00:00 via INTRAVENOUS

## 2019-08-27 MED ORDER — ALUM & MAG HYDROXIDE-SIMETH 200-200-20 MG/5ML PO SUSP: 30.0000 mL | ORAL | Status: DC | PRN

## 2019-08-27 MED ORDER — MORPHINE SULFATE (PF) 4 MG/ML IV SOLN
0.5000 mg | INTRAVENOUS | Status: DC | PRN
  Administered 2019-08-27: 1 mg via INTRAVENOUS
  Filled 2019-08-27: qty 1

## 2019-08-27 MED ORDER — HYDROCODONE-ACETAMINOPHEN 7.5-325 MG PO TABS
1.0000 | ORAL_TABLET | ORAL | Status: DC | PRN
  Administered 2019-08-28: 2 via ORAL
  Filled 2019-08-27: qty 2

## 2019-08-27 MED ORDER — BUPIVACAINE HCL (PF) 0.5 % IJ SOLN
INTRAMUSCULAR | Status: DC | PRN
  Administered 2019-08-27: 2.5 mL

## 2019-08-27 MED ORDER — SODIUM CHLORIDE (PF) 0.9 % IJ SOLN
INTRAMUSCULAR | Status: AC
  Filled 2019-08-27: qty 50

## 2019-08-27 MED ORDER — SODIUM CHLORIDE 0.9 % IV SOLN
INTRAVENOUS | Status: DC | PRN
  Administered 2019-08-27: 60 mL

## 2019-08-27 MED ORDER — SODIUM CHLORIDE 0.9 % IV SOLN
INTRAVENOUS | Status: DC
  Administered 2019-08-27 – 2019-08-28 (×2): via INTRAVENOUS

## 2019-08-27 MED ORDER — HYDROCODONE-ACETAMINOPHEN 5-325 MG PO TABS
1.0000 | ORAL_TABLET | ORAL | Status: DC | PRN
  Administered 2019-08-27: 1 via ORAL
  Administered 2019-08-27: 2 via ORAL
  Filled 2019-08-27: qty 2

## 2019-08-27 MED ORDER — FENTANYL CITRATE (PF) 100 MCG/2ML IJ SOLN: 25.0000 ug | INTRAMUSCULAR | Status: DC | PRN

## 2019-08-27 MED ORDER — MIDAZOLAM HCL 2 MG/2ML IJ SOLN
INTRAMUSCULAR | Status: AC
  Filled 2019-08-27: qty 2

## 2019-08-27 MED ORDER — TRAMADOL HCL 50 MG PO TABS
ORAL_TABLET | ORAL | Status: AC
  Administered 2019-08-27: 50 mg via ORAL
  Filled 2019-08-27: qty 1

## 2019-08-27 MED ORDER — MENTHOL 3 MG MT LOZG
1.0000 | LOZENGE | OROMUCOSAL | Status: DC | PRN
  Filled 2019-08-27: qty 9

## 2019-08-27 MED ORDER — ENOXAPARIN SODIUM 40 MG/0.4ML ~~LOC~~ SOLN
40.0000 mg | SUBCUTANEOUS | Status: DC
  Administered 2019-08-28 – 2019-08-29 (×2): 40 mg via SUBCUTANEOUS
  Filled 2019-08-27 (×2): qty 0.4

## 2019-08-27 MED ORDER — PROPOFOL 500 MG/50ML IV EMUL
INTRAVENOUS | Status: AC
  Filled 2019-08-27: qty 50

## 2019-08-27 MED ORDER — ONDANSETRON HCL 4 MG/2ML IJ SOLN: 4.0000 mg | Freq: Once | INTRAMUSCULAR | Status: DC | PRN

## 2019-08-27 MED ORDER — EZETIMIBE 10 MG PO TABS
10.0000 mg | ORAL_TABLET | Freq: Every day | ORAL | Status: DC
  Administered 2019-08-28 – 2019-08-29 (×2): 10 mg via ORAL
  Filled 2019-08-27 (×2): qty 1

## 2019-08-27 MED ORDER — HYDROCODONE-ACETAMINOPHEN 5-325 MG PO TABS
ORAL_TABLET | ORAL | Status: AC
  Filled 2019-08-27: qty 1

## 2019-08-27 MED ORDER — TRANEXAMIC ACID-NACL 1000-0.7 MG/100ML-% IV SOLN
1000.0000 mg | INTRAVENOUS | Status: AC
  Administered 2019-08-27: 1000 mg via INTRAVENOUS

## 2019-08-27 MED ORDER — PROPOFOL 500 MG/50ML IV EMUL
INTRAVENOUS | Status: DC | PRN
  Administered 2019-08-27: 40 ug/kg/min via INTRAVENOUS

## 2019-08-27 MED ORDER — BUPIVACAINE-EPINEPHRINE (PF) 0.25% -1:200000 IJ SOLN
INTRAMUSCULAR | Status: AC
  Filled 2019-08-27: qty 30

## 2019-08-27 MED ORDER — ACETAMINOPHEN 325 MG PO TABS: 325.0000 mg | ORAL_TABLET | Freq: Four times a day (QID) | ORAL | Status: DC | PRN

## 2019-08-27 MED ORDER — CHLORHEXIDINE GLUCONATE 4 % EX LIQD: 60.0000 mL | Freq: Once | CUTANEOUS | Status: DC

## 2019-08-27 MED ORDER — CEFAZOLIN SODIUM-DEXTROSE 2-4 GM/100ML-% IV SOLN
2.0000 g | INTRAVENOUS | Status: AC
  Administered 2019-08-27: 2 g via INTRAVENOUS

## 2019-08-27 MED ORDER — ONDANSETRON HCL 4 MG PO TABS: 4.0000 mg | ORAL_TABLET | Freq: Four times a day (QID) | ORAL | Status: DC | PRN

## 2019-08-27 MED ORDER — BUPIVACAINE LIPOSOME 1.3 % IJ SUSP
INTRAMUSCULAR | Status: AC
  Filled 2019-08-27: qty 20

## 2019-08-27 MED ORDER — TRAMADOL HCL 50 MG PO TABS
50.0000 mg | ORAL_TABLET | Freq: Four times a day (QID) | ORAL | Status: DC
  Administered 2019-08-27 – 2019-08-29 (×7): 50 mg via ORAL
  Filled 2019-08-27 (×6): qty 1

## 2019-08-27 MED ORDER — TRANEXAMIC ACID-NACL 1000-0.7 MG/100ML-% IV SOLN
INTRAVENOUS | Status: AC
  Filled 2019-08-27: qty 100

## 2019-08-27 MED ORDER — ZOLPIDEM TARTRATE 5 MG PO TABS: 5.0000 mg | ORAL_TABLET | Freq: Every evening | ORAL | Status: DC | PRN

## 2019-08-27 MED ORDER — DIPHENHYDRAMINE HCL 12.5 MG/5ML PO ELIX: 12.5000 mg | ORAL_SOLUTION | ORAL | Status: DC | PRN

## 2019-08-27 MED ORDER — PANTOPRAZOLE SODIUM 40 MG PO TBEC
40.0000 mg | DELAYED_RELEASE_TABLET | Freq: Every day | ORAL | Status: DC
  Administered 2019-08-28 – 2019-08-29 (×2): 40 mg via ORAL
  Filled 2019-08-27 (×2): qty 1

## 2019-08-27 MED ORDER — ASPIRIN EC 325 MG PO TBEC: 325.0000 mg | DELAYED_RELEASE_TABLET | Freq: Every day | ORAL | Status: DC | PRN

## 2019-08-27 MED ORDER — BISACODYL 10 MG RE SUPP: 10.0000 mg | Freq: Every day | RECTAL | Status: DC | PRN

## 2019-08-27 SURGICAL SUPPLY — 57 items
APL PRP STRL LF DISP 70% ISPRP (MISCELLANEOUS) ×1
ARTICULEZE HEAD (Hips) ×2 IMPLANT
CANISTER SUCT 1200ML W/VALVE (MISCELLANEOUS) ×2 IMPLANT
CANISTER SUCT 3000ML PPV (MISCELLANEOUS) ×4 IMPLANT
CANISTER WOUND CARE 500ML ATS (WOUND CARE) ×2 IMPLANT
CHLORAPREP W/TINT 26 (MISCELLANEOUS) ×2 IMPLANT
COVER BACK TABLE REUSABLE LG (DRAPES) ×2 IMPLANT
COVER WAND RF STERILE (DRAPES) ×2 IMPLANT
DRAPE 3/4 80X56 (DRAPES) ×4 IMPLANT
DRAPE INCISE IOBAN 66X60 STRL (DRAPES) ×4 IMPLANT
DRAPE STERI IOBAN 125X83 (DRAPES) ×1 IMPLANT
DRESSING SURGICEL FIBRLLR 1X2 (HEMOSTASIS) IMPLANT
DRSG MEPILEX SACRM 8.7X9.8 (GAUZE/BANDAGES/DRESSINGS) ×1 IMPLANT
DRSG SURGICEL FIBRILLAR 1X2 (HEMOSTASIS) ×2
ELECT BLADE 6.5 EXT (BLADE) ×2 IMPLANT
ELECT CAUTERY BLADE 6.4 (BLADE) ×2 IMPLANT
ELECT REM PT RETURN 9FT ADLT (ELECTROSURGICAL) ×2
ELECTRODE REM PT RTRN 9FT ADLT (ELECTROSURGICAL) ×1 IMPLANT
GLOVE BIOGEL PI IND STRL 9 (GLOVE) ×1 IMPLANT
GLOVE BIOGEL PI INDICATOR 9 (GLOVE) ×1
GLOVE INDICATOR 8.0 STRL GRN (GLOVE) ×2 IMPLANT
GLOVE SURG ORTHO 8.0 STRL STRW (GLOVE) ×2 IMPLANT
GLOVE SURG SYN 9.0  PF PI (GLOVE) ×2
GLOVE SURG SYN 9.0 PF PI (GLOVE) ×2 IMPLANT
GOWN STRL REUS W/ TWL LRG LVL3 (GOWN DISPOSABLE) ×1 IMPLANT
GOWN STRL REUS W/ TWL XL LVL3 (GOWN DISPOSABLE) ×1 IMPLANT
GOWN STRL REUS W/TWL LRG LVL3 (GOWN DISPOSABLE) ×2
GOWN STRL REUS W/TWL XL LVL3 (GOWN DISPOSABLE) ×2
HEAD ARTICULEZE (Hips) IMPLANT
HEMOSTAT SURGICEL 2X3 (HEMOSTASIS) ×4 IMPLANT
HOLDER FOLEY CATH W/STRAP (MISCELLANEOUS) ×2 IMPLANT
HOOD PEEL AWAY FLYTE STAYCOOL (MISCELLANEOUS) ×4 IMPLANT
KIT PREVENA INCISION MGT 13 (CANNISTER) ×2 IMPLANT
KIT TURNOVER KIT A (KITS) ×2 IMPLANT
LINER ACETABULAR 36MM 56MM HIP (Liner) ×1 IMPLANT
NDL SAFETY ECLIPSE 18X1.5 (NEEDLE) ×1 IMPLANT
NEEDLE HYPO 18GX1.5 SHARP (NEEDLE) ×2
NS IRRIG 1000ML POUR BTL (IV SOLUTION) ×2 IMPLANT
PACK HIP PROSTHESIS (MISCELLANEOUS) ×2 IMPLANT
PULSAVAC PLUS IRRIG FAN TIP (DISPOSABLE) ×2
RING LOCK ACET OD 56/68 (Hips) ×1 IMPLANT
SCREW 6.5MMX30MM (Screw) ×1 IMPLANT
SOL .9 NS 3000ML IRR  AL (IV SOLUTION) ×1
SOL .9 NS 3000ML IRR AL (IV SOLUTION) ×1
SOL .9 NS 3000ML IRR UROMATIC (IV SOLUTION) ×1 IMPLANT
STAPLER SKIN PROX 35W (STAPLE) ×2 IMPLANT
SUT DVC 2 QUILL PDO  T11 36X36 (SUTURE) ×1
SUT DVC 2 QUILL PDO T11 36X36 (SUTURE) ×1 IMPLANT
SUT ETHIBOND #5 BRAIDED 30INL (SUTURE) IMPLANT
SUT V-LOC 90 ABS DVC 3-0 CL (SUTURE) ×4 IMPLANT
SUT VIC AB 1 CT1 36 (SUTURE) ×2 IMPLANT
SWAB CULTURE AMIES ANAERIB BLU (MISCELLANEOUS) ×1 IMPLANT
SYR 20ML LL LF (SYRINGE) ×2 IMPLANT
TIP FAN IRRIG PULSAVAC PLUS (DISPOSABLE) ×1 IMPLANT
TIP IRRIG/SUCT HIGH CAPACITY (MISCELLANEOUS) ×2 IMPLANT
TOWEL OR 17X26 4PK STRL BLUE (TOWEL DISPOSABLE) ×2 IMPLANT
TRAY FOLEY MTR SLVR 16FR STAT (SET/KITS/TRAYS/PACK) ×2 IMPLANT

## 2019-08-27 NOTE — Anesthesia Preprocedure Evaluation (Signed)
Anesthesia Evaluation  Patient identified by MRN, date of birth, ID band Patient awake    Reviewed: Allergy & Precautions, NPO status , Patient's Chart, lab work & pertinent test results  History of Anesthesia Complications Negative for: history of anesthetic complications  Airway Mallampati: II  TM Distance: >3 FB Neck ROM: Full    Dental no notable dental hx.    Pulmonary neg pulmonary ROS, neg sleep apnea, neg COPD,    breath sounds clear to auscultation- rhonchi (-) wheezing      Cardiovascular Exercise Tolerance: Good (-) hypertension(-) CAD, (-) Past MI, (-) Cardiac Stents and (-) CABG  Rhythm:Regular Rate:Normal - Systolic murmurs and - Diastolic murmurs    Neuro/Psych neg Seizures negative neurological ROS  negative psych ROS   GI/Hepatic Neg liver ROS, GERD  ,  Endo/Other  negative endocrine ROSneg diabetes  Renal/GU negative Renal ROS     Musculoskeletal  (+) Arthritis ,   Abdominal (+) + obese,   Peds  Hematology negative hematology ROS (+)   Anesthesia Other Findings Past Medical History: No date: Bursitis No date: Chest pain, atypical     Comment:  a. 03/2006 Cath: EF 55%, mild luminal irregularities; b.               01/2009 Lexi MV: EF 64%, fixed inferior defect w normal               wall motion, likely diaphragmatic attenuation. No               ischemia;  c. 2014 Ex MV: no ischemia-->c/p improved w/               PPI;  d. 01/2015 ETT: Ex time 9 mins, Max HR 141, no st/t               changes; e. 01/2017 Ex MV: No isch, EF 46% (artifact - no               by echo). No date: CNS (central nervous system disease)     Comment:  AVM s/p surgery in 1996 No date: GERD (gastroesophageal reflux disease) No date: History of echocardiogram     Comment:  a. 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 Fletcher Anon -  felt to be Ca2+ trabeculation), mildly dil              LA; b. 03/2017 Echo: EF 55-60%, no rwma, Asc Ao 3.82mm, nl               RV fxn, mildly dil RA. No date: Hyperlipidemia No date: Hyperuricemia No date: Osteoarthritis     Comment:  hands and feet No date: Plantar fasciitis No date: Sinusitis No date: Syncope and collapse     Comment:  history No date: Tendonitis   Reproductive/Obstetrics                             Lab Results  Component Value Date   WBC 5.2 08/21/2019   HGB 14.7 08/21/2019   HCT 42.6 08/21/2019   MCV 85.9 08/21/2019   PLT 212 08/21/2019    Anesthesia Physical Anesthesia Plan  ASA: II  Anesthesia Plan: Spinal   Post-op Pain  Management:    Induction:   PONV Risk Score and Plan: 1 and Propofol infusion  Airway Management Planned: Natural Airway  Additional Equipment:   Intra-op Plan:   Post-operative Plan:   Informed Consent: I have reviewed the patients History and Physical, chart, labs and discussed the procedure including the risks, benefits and alternatives for the proposed anesthesia with the patient or authorized representative who has indicated his/her understanding and acceptance.     Dental advisory given  Plan Discussed with: CRNA and Anesthesiologist  Anesthesia Plan Comments:         Anesthesia Quick Evaluation

## 2019-08-27 NOTE — Evaluation (Signed)
Physical Therapy Evaluation Patient Details Name: Joel Galvan MRN: [id number] DOB: 04-Nov-1945 Today's Date: 08/27/2019   History of Present Illness  Pt is 73 yo male s/p revision of R hip replacement with poly exchange 11/24. PMH of chest pain, GERD, OA, HLD. Previous hip replacement was in 1998.    Clinical Impression  Pt seen in PACU. Pt alert, able to DF/PF ankle, perform quad set and pt reported improved sensation, able to feet PT's touch to feet, calf, thigh. Endorsed 6/10 R hip pain. The patient reported that he lives in a one story home with his wife, previously independent at baseline.  The patient demonstrated therapeutic exercises with some physical assist, verbal/tactile cues. Progressed to performing R SLR AROM with repetition. Supine <> sit with supervision. Able to sit EOB with good balance, sit <> Stand with CGA and cues for hand placement. The patient was able to stand with fair balance without RW support. The patient ambulated ~67ft with RW and CGA, no unsteadiness/buckling noted, decreased gait velocity, progressed to step through gait pattern. Returned to bed with all needs in reach.  Overall the patient demonstrated deficits (see "PT Problem List") that impede the patient's functional abilities, safety, and mobility and would benefit from skilled PT intervention. Recommendation is HHPT. Pt instructed to be up in chair at some point tonight with nursing once obtaining a room.     Follow Up Recommendations Home health PT    Equipment Recommendations  Rolling walker with 5" wheels    Recommendations for Other Services       Precautions / Restrictions Precautions Precautions: Anterior Hip Precaution Booklet Issued: No Restrictions Weight Bearing Restrictions: Yes RLE Weight Bearing: Weight bearing as tolerated      Mobility  Bed Mobility Overal bed mobility: Needs Assistance Bed Mobility: Supine to Sit;Sit to Supine     Supine to sit: Supervision;HOB  elevated Sit to supine: Supervision;HOB elevated      Transfers Overall transfer level: Needs assistance Equipment used: Rolling walker (2 wheeled) Transfers: Sit to/from Stand Sit to Stand: Min guard            Ambulation/Gait   Gait Distance (Feet): 10 Feet Assistive device: Rolling walker (2 wheeled)   Gait velocity: decreased      Stairs            Wheelchair Mobility    Modified Rankin (Stroke Patients Only)       Balance Overall balance assessment: Needs assistance Sitting-balance support: Feet supported Sitting balance-Leahy Scale: Good       Standing balance-Leahy Scale: Fair Standing balance comment: able to stand without UE support                             Pertinent Vitals/Pain Pain Assessment: 0-10 Pain Score: 6  Pain Location: R hip Pain Descriptors / Indicators: Aching;Grimacing;Sore Pain Intervention(s): Limited activity within patient's tolerance;Monitored during session;Repositioned;Ice applied    Home Living Family/patient expects to be discharged to:: Private residence Living Arrangements: Spouse/significant other Available Help at Discharge: Family;Available 24 hours/day Type of Home: House Home Access: Stairs to enter Entrance Stairs-Rails: Right Entrance Stairs-Number of Steps: 7 ( may be able to use half wall in addition to rail on the R) Home Layout: One level;Other (Comment)(has an office with some steps in the house, does not need to access it) Home Equipment: Bedside commode;Tub bench;Cane - single point;Crutches      Prior Function Level of Independence: Independent  Comments: Pt unsure if he has a RW at home     Hand Dominance        Extremity/Trunk Assessment   Upper Extremity Assessment Upper Extremity Assessment: Overall WFL for tasks assessed    Lower Extremity Assessment Lower Extremity Assessment: RLE deficits/detail;LLE deficits/detail RLE Deficits / Details: s/p THA,  able to perform SLR LLE Deficits / Details: WFLs       Communication   Communication: No difficulties  Cognition Arousal/Alertness: Awake/alert Behavior During Therapy: WFL for tasks assessed/performed Overall Cognitive Status: Within Functional Limits for tasks assessed                                        General Comments      Exercises Total Joint Exercises Ankle Circles/Pumps: AROM;Both;5 reps Quad Sets: AROM;Right;10 reps;Strengthening Gluteal Sets: AROM;Strengthening;Both;10 reps Heel Slides: AAROM;Strengthening;Right;10 reps Straight Leg Raises: AROM;AAROM;Strengthening;Right;10 reps   Assessment/Plan    PT Assessment Patient needs continued PT services  PT Problem List Decreased mobility;Decreased strength;Decreased range of motion;Decreased knowledge of precautions;Decreased activity tolerance;Decreased balance;Decreased knowledge of use of DME;Pain       PT Treatment Interventions DME instruction;Therapeutic exercise;Gait training;Balance training;Stair training;Neuromuscular re-education;Functional mobility training;Therapeutic activities;Patient/family education    PT Goals (Current goals can be found in the Care Plan section)  Acute Rehab PT Goals Patient Stated Goal: to go home PT Goal Formulation: With patient Time For Goal Achievement: 09/10/19 Potential to Achieve Goals: Good    Frequency BID   Barriers to discharge        Co-evaluation               AM-PAC PT "6 Clicks" Mobility  Outcome Measure Help needed turning from your back to your side while in a flat bed without using bedrails?: A Little Help needed moving from lying on your back to sitting on the side of a flat bed without using bedrails?: A Little Help needed moving to and from a bed to a chair (including a wheelchair)?: A Little Help needed standing up from a chair using your arms (e.g., wheelchair or bedside chair)?: A Little Help needed to walk in hospital  room?: A Little Help needed climbing 3-5 steps with a railing? : A Little 6 Click Score: 18    End of Session Equipment Utilized During Treatment: Gait belt Activity Tolerance: Patient tolerated treatment well Patient left: in bed;with call bell/phone within reach;with bed alarm set;with SCD's reapplied;with nursing/sitter in room Nurse Communication: Mobility status PT Visit Diagnosis: Other abnormalities of gait and mobility (R26.89);Muscle weakness (generalized) (M62.81);Pain Pain - Right/Left: Right Pain - part of body: Hip    Time: 0932-3557 PT Time Calculation (min) (ACUTE ONLY): 38 min   Charges:   PT Evaluation $PT Eval Low Complexity: 1 Low PT Treatments $Therapeutic Exercise: 23-37 mins       Lieutenant Diego PT, DPT 4:28 PM,08/27/19 [phone]

## 2019-08-27 NOTE — Transfer of Care (Signed)
Immediate Anesthesia Transfer of Care Note  Patient: Joel Galvan  Procedure(s) Performed: TOTAL HIP REVISION POLY EXCHANGE (Right Hip)  Patient Location: PACU  Anesthesia Type:General  Level of Consciousness: awake and alert   Airway & Oxygen Therapy: Patient Spontanous Breathing  Post-op Assessment: Report given to RN and Post -op Vital signs reviewed and stable  Post vital signs: Reviewed and stable  Last Vitals:  Vitals Value Taken Time  BP 103/63 08/27/19 1228  Temp 35.3 C 08/27/19 1228  Pulse 66 08/27/19 1231  Resp 0 08/27/19 1230  SpO2 100 % 08/27/19 1231  Vitals shown include unvalidated device data.  Last Pain:  Vitals:   08/27/19 1228  TempSrc:   PainSc: 0-No pain         Complications: No apparent anesthesia complications

## 2019-08-27 NOTE — Anesthesia Procedure Notes (Signed)
Spinal  Patient location during procedure: OR Start time: 08/27/2019 10:07 AM End time: 08/27/2019 10:11 AM Staffing Anesthesiologist: Emmie Niemann, MD Resident/CRNA: Fredderick Phenix, CRNA Performed: resident/CRNA  Preanesthetic Checklist Completed: patient identified, site marked, surgical consent, pre-op evaluation, timeout performed, IV checked, risks and benefits discussed and monitors and equipment checked Spinal Block Patient position: sitting Prep: ChloraPrep Patient monitoring: heart rate, continuous pulse ox and blood pressure Approach: midline Location: L4-5 Injection technique: single-shot Needle Needle type: Introducer and Pencil-Tip  Needle gauge: 24 G Needle length: 9 cm Additional Notes Negative paresthesia. Negative blood return. Positive free-flowing CSF. Expiration date of kit checked and confirmed. Patient tolerated procedure well, without complications.

## 2019-08-27 NOTE — Anesthesia Post-op Follow-up Note (Signed)
Anesthesia QCDR form completed.        

## 2019-08-27 NOTE — Op Note (Signed)
08/27/2019  12:34 PM  PATIENT:  Joel Galvan  73 y.o. male  PRE-OPERATIVE DIAGNOSIS:  POLYETHEYLINE LINER EXCHANGE RIGHT HIP and revision of femoral head components  POST-OPERATIVE DIAGNOSIS:  POLYETHEYLINE LINER EXCHANGE RIGHT HIP and revision of femoral head component  PROCEDURE:  Procedure(s): TOTAL HIP REVISION POLY EXCHANGE (Right) and revision of femoral head  SURGEON: Leitha Schuller, MD  ASSISTANTS: None  ANESTHESIA:   spinal  EBL:  Total I/O In: 700 [I.V.:700] Out: 240 [Urine:40; Blood:200]  BLOOD ADMINISTERED:none  DRAINS: none   LOCAL MEDICATIONS USED:  MARCAINE    and OTHER Exparel  SPECIMEN:  Source of Specimen:  Culture of synovial fluid  DISPOSITION OF SPECIMEN:  Microbiology  COUNTS:  YES  TOURNIQUET:  * No tourniquets in log *  IMPLANTS: Depuy 56 mm x 36 mm plus 4 Duraloc with 36 mm _5 head, locking ricg 56 mm  DICTATION: .Dragon Dictation patient was brought to the operating room and after adequate anesthesia was obtained the patient was placed in a supine position with the mid active attachment and the right foot in the mid active attachment.  C arm was brought in and good visualization of the hip was obtained with a preop x-ray for templating done.  After prepping and draping in the usual sterile fashion appropriate patient identification and timeout procedures were completed and AMIS approach was made centered over the T FL.  After dissecting down and releasing the deep fascia aqua Manis was used to cauterize the anterior femoral circumflex vessels.  After exposing the anterior capsule the foot was externally rotated and a flap made centered laterally with a self-retaining retractor placed the femoral head could be visualized.  There was extensive debris present in the joint and culture was obtained at this point.  There was no evidence of infection however.  There was extensive granulation tissue tissue which was excised until the head was exposed  adequately along with the superior acetabular component.  The head was removed using a tamponade cut off the trunnion then extensive debridement of granulation tissue was performed circumferentially around the cup the cup was removed first trying to use an osteotome and using the straight screw technique drilling a hole and using a titanium screw to pop the cup out.  The ring liner was then removed.  With with the wire centrally.  Next the hip was thoroughly irrigated and additional debris removed to get adequate exposure of the cup new liner was placed using first the wire ring and was some difficulty getting into lockers presumably some scar tissue but when adequate retraction and exposure was carried out the locking mechanism did work and the cup was stable.  The metal cup appeared stable and did not move with any maneuver.  Following this the the cup chosen was based on the same size as the previous visit had been the appropriate size.  The same size head was then used for neck length although we went to a 36 diameter head the head was placed after cleaning the trunnion the trainee itself looked to be in quite good condition without corrosion the head was impacted and the hip reduced it was stable to 100 degrees external rotation.  Prior to placement of the implants the hip was injected with Exparel for postop analgesia as well as 30 cc of quarter percent Sensorcaine with epinephrine.  The hip was also thoroughly irrigated with pulsatile lavage prior to implantation of the new polyethylene with antibiotic irrigation to try to  get rid of any loose debris.  The hip was then closed with a running heavy Quill for the T FL fascia 3 OV lock subcutaneously skin staples and incisional wound VAC.  PLAN OF CARE: Admit to inpatient   PATIENT DISPOSITION:  PACU - hemodynamically stable.

## 2019-08-27 NOTE — H&P (Signed)
Reviewed paper H+P, will be scanned into chart. No changes noted.  

## 2019-08-28 ENCOUNTER — Encounter: Payer: Self-pay | Admitting: Orthopedic Surgery

## 2019-08-28 MED ORDER — SODIUM CHLORIDE 0.9 % IV BOLUS
1000.0000 mL | Freq: Once | INTRAVENOUS | Status: AC
  Administered 2019-08-28: 1000 mL via INTRAVENOUS

## 2019-08-28 MED ORDER — HYDROCODONE-ACETAMINOPHEN 5-325 MG PO TABS: 1.0000 | ORAL_TABLET | ORAL | 0 refills | Status: DC | PRN

## 2019-08-28 NOTE — Discharge Summary (Signed)
Physician Discharge Summary  Patient ID: Joel Galvan MRN: [id number] DOB/AGE: Dec 13, 1945 73 y.o.  Admit date: 08/27/2019 Discharge date: 08/29/2019  Admission Diagnoses:  POLYETHEYLINE LINER EXCHANGE RIGHT HIP  Discharge Diagnoses: Patient Active Problem List   Diagnosis Date Noted  . 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 Elevated blood pressure 12/09/2014  . Hyperlipidemia 09/04/2009  . Chest pain 09/04/2009    Past Medical History:  Diagnosis Date  . Bursitis   . Chest pain, atypical    a. 03/2006 Cath: EF 55%, mild luminal irregularities; b. 01/2009 Lexi MV: EF 64%, fixed inferior defect w normal wall motion, likely diaphragmatic attenuation. No ischemia;  c. 2014 Ex MV: no ischemia-->c/p improved w/ PPI;  d. 01/2015 ETT: Ex time 9 mins, Max HR 141, no st/t changes; e. 01/2017 Ex MV: No isch, EF 46% (artifact - no by echo).  . CNS (central nervous system disease)    AVM s/p surgery in 1996  . GERD (gastroesophageal reflux disease)   . History of echocardiogram    a. 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 03/2017 Echo: EF 55-60%, no rwma, Asc Ao 3.7mm, nl RV fxn, mildly dil RA.  Marland Kitchen Hyperlipidemia   . Hyperuricemia   . Osteoarthritis    hands and feet  . Plantar fasciitis   . Sinusitis   . Syncope and collapse    history  . Tendonitis    Transfusion: none   Consultants (if any):   Discharged Condition: Improved  Hospital Course: Joel Galvan is an 73 y.o. male who was admitted 08/27/2019 with a diagnosis of right hip polyetheyline loosening and went to the operating room on 08/27/2019 and underwent the above named procedures.    Surgeries: Procedure(s): TOTAL HIP REVISION POLY EXCHANGE on 08/27/2019 Patient tolerated the surgery well. Taken to PACU where she was stabilized and then transferred to the orthopedic floor.  Started on  Lovenox 40 mg q 24 hrs. Foot pumps applied bilaterally at 80 mm. Heels elevated on bed with rolled towels. No evidence of DVT. Negative Homan. Physical therapy started on day #1 for gait training and transfer. OT started day #1 for ADL and assisted devices.  Implants: Depuy 56 mm x 36 mm plus 4 Duraloc with 36 mm _5 head, locking ring 56 mm  He was given perioperative antibiotics:  Anti-infectives (From admission, onward)   Start     Dose/Rate Route Frequency Ordered Stop   08/27/19 0837  ceFAZolin (ANCEF) 2-4 GM/100ML-% IVPB    Note to Pharmacy: Mike Craze   : cabinet override      08/27/19 0837 08/27/19 1026   08/27/19 0600  ceFAZolin (ANCEF) IVPB 2g/100 mL premix     2 g 200 mL/hr over 30 Minutes Intravenous On call to O.R. 08/27/19 0008 08/27/19 1026    .  He was given sequential compression devices, early ambulation, and Aspirin for DVT prophylaxis.  He benefited maximally from the hospital stay and there were no complications.    Recent vital signs:  Vitals:   08/29/19 0626 08/29/19 0723  BP: 116/78 109/78  Pulse: 73 76  Resp: 18 16  Temp: 99.3 F (37.4 C) 99.1 F (37.3 C)  SpO2: 99% 97%    Recent laboratory studies:  Lab Results  Component Value Date   HGB 14.1 08/27/2019   HGB 14.7 08/21/2019   HGB 16.0  11/05/2017   Lab Results  Component Value Date   WBC 10.7 (H) 08/27/2019   PLT 192 08/27/2019   Lab Results  Component Value Date   INR 1.0 08/21/2019   Lab Results  Component Value Date   NA 139 08/21/2019   K 4.4 08/21/2019   CL 100 08/21/2019   CO2 25 08/21/2019   BUN 16 08/21/2019   CREATININE 1.11 08/27/2019   GLUCOSE 102 (H) 08/21/2019    Discharge Medications:   Allergies as of 08/29/2019      Reactions   Methocarbamol Hives   Prednisone Other (See Comments)   Necrosis in hip joints Necrosis in hip joints      Medication List    TAKE these medications   acetaminophen 500 MG tablet Commonly known as: TYLENOL Take 2 tablets  (1,000 mg total) by mouth every 8 (eight) hours.   aspirin 325 MG tablet Take 325 mg by mouth daily as needed (pain.).   atorvastatin 10 MG tablet Commonly known as: LIPITOR Take 5 mg by mouth every Monday, Wednesday, and Friday. In the morning   HYDROcodone-acetaminophen 5-325 MG tablet Commonly known as: NORCO/VICODIN Take 1-2 tablets by mouth every 4 (four) hours as needed for moderate pain (pain score 4-6).   naproxen 500 MG tablet Commonly known as: NAPROSYN Take 500 mg by mouth 2 (two) times daily as needed (severe pain.).   naproxen sodium 220 MG tablet Commonly known as: ALEVE Take 440 mg by mouth 2 (two) times daily as needed (for pain.).   Nasacort Allergy 24HR 55 MCG/ACT Aero nasal inhaler Generic drug: triamcinolone Place 2 sprays into the nose daily as needed (allergies.).   pantoprazole 40 MG tablet Commonly known as: PROTONIX TAKE 1 TABLET (40 MG TOTAL) BY MOUTH DAILY. What changed: See the new instructions.   tadalafil 5 MG tablet Commonly known as: CIALIS Take 5 mg by mouth daily as needed for erectile dysfunction.   Zetia 10 MG tablet Generic drug: ezetimibe Take 10 mg by mouth daily. In the mornings.            Durable Medical Equipment  (From admission, onward)         Start     Ordered   08/27/19 1426  DME Walker rolling  Once    Question:  Patient needs a walker to treat with the following condition  Answer:  S/P revision of total hip   08/27/19 1425   08/27/19 1426  DME 3 n 1  Once     08/27/19 1425   08/27/19 1426  DME Bedside commode  Once    Question:  Patient needs a bedside commode to treat with the following condition  Answer:  S/P revision of total hip   08/27/19 1425          Diagnostic Studies: Dg Hip Operative Unilat W Or W/o Pelvis Right  Result Date: 08/27/2019 CLINICAL DATA:  Revision of right hip prosthesis. EXAM: OPERATIVE right HIP (WITH PELVIS IF PERFORMED) 2 VIEWS TECHNIQUE: Fluoroscopic spot image(s) were  submitted for interpretation post-operatively. Radiation exposure index: 2.4 mGy. COMPARISON:  None. FINDINGS: Two intraoperative fluoroscopic images were obtained of the right hip. The right acetabular and femoral components appear to be well situated. IMPRESSION: Status post right total hip arthroplasty. Electronically Signed   By: Marijo Conception M.D.   On: 08/27/2019 12:27   Dg Hip Unilat With Pelvis 2-3 Views Right  Result Date: 08/27/2019 CLINICAL DATA:  Total right hip replacement. EXAM: DG  HIP (WITH OR WITHOUT PELVIS) 2-3V RIGHT COMPARISON:  No prior. FINDINGS: Postoperative changes right hip noted. Total right hip replacement with anatomic alignment. No acute bony abnormality identified. No evidence of fracture. IMPRESSION: Total right hip replacement with anatomic alignment. Electronically Signed   By: Maisie Fushomas  Register   On: 08/27/2019 14:16   Disposition: Plan for possible discharge home on 08/29/19 pending progress with therapy.  Follow-up Information    Evon SlackGaines, Thomas C, PA-C Follow up in 2 week(s).   Specialties: Orthopedic Surgery, Emergency Medicine Contact information: [address] [place] [address] [phone]          Signed: Meriel PicaJames L Ellagrace Yoshida PA-C 08/29/2019, 8:51 AM

## 2019-08-28 NOTE — Progress Notes (Addendum)
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 PMH of chest pain, GERD, OA, HLD. Previous hip replacement was in 1998.    PT Comments    Pt alert, in bed agreeable to PT endorsed R hip pain 4/10 at rest 8/10 with mobility. The patient performed a few therapeutic exercises prior to mobilizing, session focused on attempting to progress out of bed mobility. PT and OT saw pt as co-treat for patient safety. Pt continued to be limited by orthostatic hypotension BP, though pt reported improvement in symptoms from this AM. Most notable in standing BP 79/67, and pt reported resolvement in symptoms in sitting. Pt performed sit <> stand with very little minA and took several steps to the recliner in room, CGAx2 for safety, BP 116/79 in chair. RN and MD notified of BP status. The patient would benefit from further skilled PT intervention to return to PLOF as able.    Follow Up Recommendations  Home health PT     Equipment Recommendations  Rolling walker with 5" wheels    Recommendations for Other Services       Precautions / Restrictions Precautions Precautions: Anterior Hip Restrictions Weight Bearing Restrictions: Yes RLE Weight Bearing: Weight bearing as tolerated    Mobility  Bed Mobility Overal bed mobility: Needs Assistance Bed Mobility: Supine to Sit     Supine to sit: Min assist;HOB elevated     General bed mobility comments: very little physical assist to assist with weight shift due to increased pain this PM  Transfers Overall transfer level: Needs assistance Equipment used: Rolling walker (2 wheeled) Transfers: Sit to/from Stand Sit to Stand: Min assist         General transfer comment: very minimal physical assist for rise from bed edge  Ambulation/Gait   Gait Distance (Feet): 2  Feet Assistive device: Rolling walker (2 wheeled)       General Gait Details: very small steps to recliner in the room, CGA x2 for safety   Stairs             Wheelchair Mobility    Modified Rankin (Stroke Patients Only)       Balance Overall balance assessment: Needs assistance Sitting-balance support: Feet supported Sitting balance-Leahy Scale: Good                                      Cognition Arousal/Alertness: Awake/alert Behavior During Therapy: WFL for tasks assessed/performed Overall Cognitive Status: Within Functional Limits for tasks assessed                                        Exercises Total Joint Exercises Ankle Circles/Pumps: AROM;Both;15 reps Quad Sets: AROM;Right;Strengthening;15 reps Marching in Standing: AROM;Seated;5 reps;Both    General Comments        Pertinent Vitals/Pain Pain Assessment: 0-10 Pain Score: 8  Pain Location: R hip Pain Descriptors / Indicators: Aching Pain Intervention(s): Limited activity within patient's tolerance;Monitored during session;Repositioned    Home Living                      Prior Function  PT Goals (current goals can now be found in the care plan section) Progress towards PT goals: Not progressing toward goals - comment(limited by BP)    Frequency    BID      PT Plan Current plan remains appropriate    Co-evaluation PT/OT/SLP Co-Evaluation/Treatment: Yes Reason for Co-Treatment: For patient/therapist safety;Complexity of the patient's impairments (multi-system involvement);To address functional/ADL transfers PT goals addressed during session: Mobility/safety with mobility;Proper use of DME;Strengthening/ROM OT goals addressed during session: ADL's and self-care;Strengthening/ROM      AM-PAC PT "6 Clicks" Mobility   Outcome Measure  Help needed turning from your back to your side while in a flat bed without using bedrails?: A  Little Help needed moving from lying on your back to sitting on the side of a flat bed without using bedrails?: A Little Help needed moving to and from a bed to a chair (including a wheelchair)?: A Little Help needed standing up from a chair using your arms (e.g., wheelchair or bedside chair)?: A Little Help needed to walk in hospital room?: A Little Help needed climbing 3-5 steps with a railing? : A Little 6 Click Score: 18    End of Session Equipment Utilized During Treatment: Gait belt Activity Tolerance: Treatment limited secondary to medical complications (Comment);Other (comment) Patient left: with call bell/phone within reach;with SCD's reapplied;with nursing/sitter in room;in chair(with OT at bedside) Nurse Communication: Mobility status PT Visit Diagnosis: Other abnormalities of gait and mobility (R26.89);Muscle weakness (generalized) (M62.81);Pain Pain - Right/Left: Right Pain - part of body: Hip     Time: 6553-7482 PT Time Calculation (min) (ACUTE ONLY): 29 min  Charges:  $Therapeutic Exercise: 8-22 mins $Therapeutic Activity: 8-22 mins                     Lieutenant Diego PT, DPT 2:12 PM,08/28/19 [phone]

## 2019-08-28 NOTE — Progress Notes (Signed)
OT Cancellation Note  Patient Details Name: Joel Galvan MRN: [id number] DOB: 1946-06-15   Cancelled Treatment:    Reason Eval/Treat Not Completed: Medical issues which prohibited therapy;Other (comment). Thank you for the OT consult. Order received and chart reviewed. This [place] Curia spoke with physical therapist who saw pt this am. PT indicated this patient became severely orthostatic during ambulation attempt, and would have difficulty participating in OT evaluation at this time. Will follow acutely and re-attempt at a later time/date as available and pt medically appropriate for OT session.   Shara Blazing, M.S., OTR/L Ascom: [phone] 08/28/19, 10:20 AM   Anesha Hackert Roosvelt Maser 08/28/2019, 10:19 AM

## 2019-08-28 NOTE — TOC Progression Note (Signed)
Transition of Care [facility]) - Progression Note    Patient Details  Name: TAEVEON KEESLING MRN: [id number] Date of Birth: [birth year]  Transition of Care [facility]) CM/SW [place], RN Phone Number: 08/28/2019, 12:24 PM  Clinical Narrative:     Requested the price of Lovenox       Expected Discharge Plan and Services                                                 Social Determinants of Health (SDOH) Interventions    Readmission Risk Interventions No flowsheet data found.

## 2019-08-28 NOTE — Discharge Instructions (Signed)
ANTERIOR APPROACH TOTAL HIP REPLACEMENT POSTOPERATIVE DIRECTIONS   Hip Rehabilitation, Guidelines Following Surgery  The results of a hip operation are greatly improved after range of motion and muscle strengthening exercises. Follow all safety measures which are given to protect your hip. If any of these exercises cause increased pain or swelling in your joint, decrease the amount until you are comfortable again. Then slowly increase the exercises. Call your caregiver if you have problems or questions.   HOME CARE INSTRUCTIONS  Remove items at home which could result in a fall. This includes throw rugs or furniture in walking pathways.   ICE to the affected hip every three hours for 30 minutes at a time and then as needed for pain and swelling.  Continue to use ice on the hip for pain and swelling from surgery. You may notice swelling that will progress down to the foot and ankle.  This is normal after surgery.  Elevate the leg when you are not up walking on it.    Continue to use the breathing machine which will help keep your temperature down.  It is common for your temperature to cycle up and down following surgery, especially at night when you are not up moving around and exerting yourself.  The breathing machine keeps your lungs expanded and your temperature down.  Do not place pillow under knee, focus on keeping the knee straight while resting  DIET You may resume your previous home diet once your are discharged from the hospital.  DRESSING / WOUND CARE / SHOWERING Please remove provena negative pressure dressing on 09/04/2019 and apply honey comb dressing. Keep dressing clean and dry at all times.  ACTIVITY Walk with your walker as instructed. Use walker as long as suggested by your caregivers. Avoid periods of inactivity such as sitting longer than an hour when not asleep. This helps prevent blood clots.  You may resume a sexual relationship in one month or when given the OK by  your doctor.  You may return to work once you are cleared by your doctor.  Do not drive a car for 6 weeks or until released by you surgeon.  Do not drive while taking narcotics.  WEIGHT BEARING Weight bearing as tolerated. Use walker/cane as needed for at least 4 weeks post op.  POSTOPERATIVE CONSTIPATION PROTOCOL Constipation - defined medically as fewer than three stools per week and severe constipation as less than one stool per week.  One of the most common issues patients have following surgery is constipation.  Even if you have a regular bowel pattern at home, your normal regimen is likely to be disrupted due to multiple reasons following surgery.  Combination of anesthesia, postoperative narcotics, change in appetite and fluid intake all can affect your bowels.  In order to avoid complications following surgery, here are some recommendations in order to help you during your recovery period.  Colace (docusate) - Pick up an over-the-counter form of Colace or another stool softener and take twice a day as long as you are requiring postoperative pain medications.  Take with a full glass of water daily.  If you experience loose stools or diarrhea, hold the colace until you stool forms back up.  If your symptoms do not get better within 1 week or if they get worse, check with your doctor.  Dulcolax (bisacodyl) - Pick up over-the-counter and take as directed by the product packaging as needed to assist with the movement of your bowels.  Take with a full  glass of water.  Use this product as needed if not relieved by Colace only.  ° °MiraLax (polyethylene glycol) - Pick up over-the-counter to have on hand.  MiraLax is a solution that will increase the amount of water in your bowels to assist with bowel movements.  Take as directed and can mix with a glass of water, juice, soda, coffee, or tea.  Take if you go more than two days without a movement. °Do not use MiraLax more than once per day. Call your  doctor if you are still constipated or irregular after using this medication for 7 days in a row. ° °If you continue to have problems with postoperative constipation, please contact the office for further assistance and recommendations.  If you experience "the worst abdominal pain ever" or develop nausea or vomiting, please contact the office immediatly for further recommendations for treatment. ° °ITCHING ° If you experience itching with your medications, try taking only a single pain pill, or even half a pain pill at a time.  You can also use Benadryl over the counter for itching or also to help with sleep.  ° °TED HOSE STOCKINGS °Wear the elastic stockings on both legs for six weeks following surgery during the day but you may remove then at night for sleeping. ° °MEDICATIONS °See your medication summary on the “After Visit Summary” that the nursing staff will review with you prior to discharge.  You may have some home medications which will be placed on hold until you complete the course of blood thinner medication.  It is important for you to complete the blood thinner medication as prescribed by your surgeon.  Continue your approved medications as instructed at time of discharge. ° °PRECAUTIONS °If you experience chest pain or shortness of breath - call 911 immediately for transfer to the hospital emergency department.  °If you develop a fever greater that 101 F, purulent drainage from wound, increased redness or drainage from wound, foul odor from the wound/dressing, or calf pain - CONTACT YOUR SURGEON.   °                                                °FOLLOW-UP APPOINTMENTS °Make sure you keep all of your appointments after your operation with your surgeon and caregivers. You should call the office at the above phone number and make an appointment for approximately two weeks after the date of your surgery or on the date instructed by your surgeon outlined in the "After Visit Summary". ° °RANGE OF MOTION  AND STRENGTHENING EXERCISES  °These exercises are designed to help you keep full movement of your hip joint. Follow your caregiver's or physical therapist's instructions. Perform all exercises about fifteen times, three times per day or as directed. Exercise both hips, even if you have had only one joint replacement. These exercises can be done on a training (exercise) mat, on the floor, on a table or on a bed. Use whatever works the best and is most comfortable for you. Use music or television while you are exercising so that the exercises are a pleasant break in your day. This will make your life better with the exercises acting as a break in routine you can look forward to.  °Lying on your back, slowly slide your foot toward your buttocks, raising your knee up off the floor. Then slowly   slide your foot back down until your leg is straight again.  °Lying on your back spread your legs as far apart as you can without causing discomfort.  °Lying on your side, raise your upper leg and foot straight up from the floor as far as is comfortable. Slowly lower the leg and repeat.  °Lying on your back, tighten up the muscle in the front of your thigh (quadriceps muscles). You can do this by keeping your leg straight and trying to raise your heel off the floor. This helps strengthen the largest muscle supporting your knee.  °Lying on your back, tighten up the muscles of your buttocks both with the legs straight and with the knee bent at a comfortable angle while keeping your heel on the floor.  ° °IF YOU ARE TRANSFERRED TO A SKILLED REHAB FACILITY °If the patient is transferred to a skilled rehab facility following release from the hospital, a list of the current medications will be sent to the facility for the patient to continue.  When discharged from the skilled rehab facility, please have the facility set up the patient's Home Health Physical Therapy prior to being released. Also, the skilled facility will be responsible  for providing the patient with their medications at time of release from the facility to include their pain medication, the muscle relaxants, and their blood thinner medication. If the patient is still at the rehab facility at time of the two week follow up appointment, the skilled rehab facility will also need to assist the patient in arranging follow up appointment in our office and any transportation needs. ° °MAKE SURE YOU:  °Understand these instructions.  °Get help right away if you are not doing well or get worse.  ° ° °Pick up stool softner and laxative for home use following surgery while on pain medications. °Continue to use ice for pain and swelling after surgery. °Do not use any lotions or creams on the incision until instructed by your surgeon. ° ° °

## 2019-08-28 NOTE — Progress Notes (Signed)
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 PMH of chest pain, GERD, OA, HLD. Previous hip replacement was in 1998.    PT Comments    Patient alert, in bed, agreeable to PT reported 4/10 hip pain. Performed exercises with verbal cues, physical assist for SLR, heel slides, hip abduction due to increased stiffness/groin pain. Supine to sit with supervision and extended time. Pt reported feeling "whoozy" initially EOB that improved with time, able to perform seated exercises. Sit <> Stand with CGA and RW. After 20-30seconds in standing the patient reported significant increase in whooziness, returned to sitting. BP 59/44, PT assisted pt to return to supine, 98/64 and then 11/74. RN notified and in room to assess patient. Overall the patient would benefit from further skilled PT to maximize safety, mobility, and independence to return to PLOF.       Follow Up Recommendations  Home health PT     Equipment Recommendations  Rolling walker with 5" wheels    Recommendations for Other Services       Precautions / Restrictions Precautions Precautions: Anterior Hip Precaution Booklet Issued: Yes (comment) Restrictions Weight Bearing Restrictions: Yes RLE Weight Bearing: Weight bearing as tolerated    Mobility  Bed Mobility Overal bed mobility: Needs Assistance Bed Mobility: Supine to Sit;Sit to Supine     Supine to sit: Supervision;HOB elevated Sit to supine: Supervision;HOB elevated      Transfers Overall transfer level: Needs assistance Equipment used: Rolling walker (2 wheeled) Transfers: Sit to/from Stand Sit to Stand: Min guard            Ambulation/Gait             General Gait Details: deferred, pt orthostatic   Stairs             Wheelchair Mobility    Modified  Rankin (Stroke Patients Only)       Balance Overall balance assessment: Needs assistance Sitting-balance support: Feet supported Sitting balance-Leahy Scale: Good       Standing balance-Leahy Scale: Fair                              Cognition Arousal/Alertness: Awake/alert Behavior During Therapy: WFL for tasks assessed/performed Overall Cognitive Status: Within Functional Limits for tasks assessed                                        Exercises Total Joint Exercises Towel Squeeze: AROM;Strengthening;15 reps;Both Short Arc Quad: AROM;Strengthening;Right;15 reps Heel Slides: AAROM;Strengthening;Right;15 reps Hip ABduction/ADduction: AAROM;Strengthening;Right;15 reps Straight Leg Raises: AAROM;Strengthening;Right;15 reps Long Arc Quad: AROM;Strengthening;Right;15 reps Marching in Standing: AROM;Seated;5 reps;Both    General Comments        Pertinent Vitals/Pain Pain Assessment: 0-10 Pain Score: 4  Pain Location: R hip Pain Descriptors / Indicators: Aching Pain Intervention(s): Limited activity within patient's tolerance;Monitored during session;Repositioned;Ice applied    Home Living                      Prior Function            PT Goals (current goals can now be found in the care plan section) Progress towards PT goals: Progressing toward  goals    Frequency    BID      PT Plan Current plan remains appropriate    Co-evaluation              AM-PAC PT "6 Clicks" Mobility   Outcome Measure  Help needed turning from your back to your side while in a flat bed without using bedrails?: A Little Help needed moving from lying on your back to sitting on the side of a flat bed without using bedrails?: A Little Help needed moving to and from a bed to a chair (including a wheelchair)?: A Little Help needed standing up from a chair using your arms (e.g., wheelchair or bedside chair)?: A Little Help needed to walk in  hospital room?: A Little Help needed climbing 3-5 steps with a railing? : A Little 6 Click Score: 18    End of Session Equipment Utilized During Treatment: Gait belt Activity Tolerance: Treatment limited secondary to medical complications (Comment);Other (comment)(orthostatic hypotension) Patient left: in bed;with call bell/phone within reach;with bed alarm set;with SCD's reapplied;with nursing/sitter in room Nurse Communication: Mobility status PT Visit Diagnosis: Other abnormalities of gait and mobility (R26.89);Muscle weakness (generalized) (M62.81);Pain Pain - Right/Left: Right Pain - part of body: Hip     Time: 8315-1761 PT Time Calculation (min) (ACUTE ONLY): 45 min  Charges:  $Therapeutic Exercise: 23-37 mins                     Olga Coaster PT, DPT 10:19 AM,08/28/19 [phone]

## 2019-08-28 NOTE — Progress Notes (Signed)
Foley removed. 1100 ml pdowless,rn

## 2019-08-28 NOTE — TOC Initial Note (Signed)
Transition of Care [facility]) - Initial/Assessment Note    Patient Details  Name: Joel Galvan MRN: [id number] Date of Birth: 11-10-1945  [facility] CM/SW Contact:    Su Hilt, RN Phone Number: 08/28/2019, 2:23 PM  Clinical Narrative:                  Met with the patient to discuss DC plan and needs He lives at home in a split level house with his wife He has a RW and BSC at home and does not need additional DME He will need HH PT and OT and I have sent the referral to [facility] His Lovenox is 10$ and I have given him the price, he can afford his medications and his wife provides transportation  Expected Discharge Plan: [place] Barriers to Discharge: Continued Medical Work up   Patient Goals and CMS Choice Patient states their goals for this hospitalization and ongoing recovery are:: go home CMS Medicare.gov Compare Post Acute Care list provided to:: Patient Choice offered to / list presented to : Patient  Expected Discharge Plan and Services Expected Discharge Plan: [place]   Discharge Planning Services: CM Consult Post Acute Care Choice: [place] arrangements for the past 2 months: Single Family Home                 DME Arranged: N/A         HH Arranged: PT, OT [place] Agency: [facility] [employer] Date [place]: 08/28/19 Time [place]: [place] Representative spoke with at [place]: [place] Arrangements/Services Living arrangements for the past 2 months: [place] Lives with:: Spouse Patient language and need for interpreter reviewed:: No Do you feel safe going back to the place where you live?: Yes      Need for Family Participation in Patient Care: No (Comment) Care giver support system in place?: Yes (comment) Current home services: DME(RW, BSC) Criminal Activity/Legal Involvement Pertinent to Current  Situation/Hospitalization: No - Comment as needed  Activities of Daily Living Home Assistive Devices/Equipment: Eyeglasses ADL Screening (condition at time of admission) Patient's cognitive ability adequate to safely complete daily activities?: Yes Is the patient deaf or have difficulty hearing?: No Does the patient have difficulty seeing, even when wearing glasses/contacts?: No Does the patient have difficulty concentrating, remembering, or making decisions?: No Patient able to express need for assistance with ADLs?: Yes Does the patient have difficulty dressing or bathing?: No Independently performs ADLs?: Yes (appropriate for developmental age) Does the patient have difficulty walking or climbing stairs?: Yes Weakness of Legs: Right Weakness of Arms/Hands: None  Permission Sought/Granted   Permission granted to share information with : Yes, Verbal Permission Granted              Emotional Assessment Appearance:: Appears stated age Attitude/Demeanor/Rapport: Engaged Affect (typically observed): Accepting, Appropriate Orientation: : Oriented to Self, Oriented to Place, Oriented to  Time, Oriented to Situation Alcohol / Substance Use: Not Applicable Psych Involvement: No (comment)  Admission diagnosis:  POLYETHEYLINE LINER EXCHANGE RIGHT HIP Patient Active Problem List   Diagnosis Date Noted  . 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 Elevated blood pressure 12/09/2014  . Hyperlipidemia 09/04/2009  . Chest pain 09/04/2009   PCP:  Rusty Aus, MD Pharmacy:   [facility], [address]  [place] [address] Phone: [phone] Fax: [phone]     Social Determinants of Health (SDOH) Interventions    Readmission Risk Interventions No flowsheet data found.

## 2019-08-28 NOTE — Anesthesia Postprocedure Evaluation (Signed)
Anesthesia Post Note  Patient: Joel Galvan  Procedure(s) Performed: TOTAL HIP REVISION POLY EXCHANGE (Right Hip)  Patient location during evaluation: PACU Anesthesia Type: Spinal Level of consciousness: oriented and awake and alert Pain management: pain level controlled Vital Signs Assessment: post-procedure vital signs reviewed and stable Respiratory status: spontaneous breathing, respiratory function stable and patient connected to nasal cannula oxygen Cardiovascular status: blood pressure returned to baseline and stable Postop Assessment: no headache, no backache and no apparent nausea or vomiting Anesthetic complications: no     Last Vitals:  Vitals:   08/28/19 1004 08/28/19 1005  BP:  111/74  Pulse:    Resp:    Temp:    SpO2: 98%     Last Pain:  Vitals:   08/28/19 0800  TempSrc: Oral  PainSc:                  Estill Batten

## 2019-08-28 NOTE — Evaluation (Signed)
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 PMH of chest pain, GERD, OA, HLD. Previous hip replacement was in 1998.   Clinical Impression   Joel Galvan was seen for OT evaluation this date, POD#1 from above surgery. Pt was active and independent in all ADLs prior to surgery. He endorses riding his bicycle and boating regularly. Pt is eager to return to PLOF with less pain and improved safety and independence. Pt currently requires minimal assist for LB dressing and bathing while in seated position due to pain and limited AROM of R hip. Pt instructed in self care skills, falls prevention strategies, home/routines modifications, DME/AE for LB bathing and dressing tasks, and compression stocking mgt strategies. Pt would benefit from additional instruction in self care skills and techniques with or without assistive devices to support recall and carryover prior to discharge. Recommend HHOT upon discharge.      Follow Up Recommendations  Home health OT    Equipment Recommendations  None recommended by OT(Pt has necessary equipment including AE)    Recommendations for Other Services       Precautions / Restrictions Precautions Precautions: Anterior Hip Precaution Booklet Issued: Yes (comment) Restrictions Weight Bearing Restrictions: Yes RLE Weight Bearing: Weight bearing as tolerated      Mobility Bed Mobility Overal bed mobility: Needs Assistance Bed Mobility: Supine to Sit     Supine to sit: Min assist;HOB elevated     General bed mobility comments: very little physical assist to assist with weight shift due to increased pain this PM  Transfers Overall transfer level: Needs assistance Equipment used: Rolling walker (2 wheeled) Transfers: Sit to/from Stand Sit to Stand: Min assist;+2  safety/equipment         General transfer comment: very minimal physical assist for rise from bed edge, OT provides +2 for safety and mgt of wound vac this date.    Balance Overall balance assessment: Needs assistance Sitting-balance support: Feet supported Sitting balance-Leahy Scale: Good       Standing balance-Leahy Scale: Fair Standing balance comment: able to stand without UE support                           ADL either performed or assessed with clinical judgement   ADL                                         General ADL Comments: Pt currently requires min A for functional transfers with supervision for functional mobility. Minimal assist for LB ADL management including bathing and dressing in seated position due to increased pain and decreased ROM of the R hip. Pt also noted to be limited by orthostatic BP with positional changes at time of OT evaluation. Expect pt to have improved functional performance with BP well controlled.     Vision Baseline Vision/History: Wears glasses Wears Glasses: At all times Patient Visual Report: No change from baseline       Perception     Praxis      Pertinent Vitals/Pain Pain Assessment: 0-10 Pain Score: 8  Pain Location: R hip Pain Descriptors / Indicators: Aching;Burning;Grimacing;Guarding Pain Intervention(s): Limited activity within patient's tolerance;Monitored during session;Repositioned  Hand Dominance Right   Extremity/Trunk Assessment Upper Extremity Assessment Upper Extremity Assessment: Overall WFL for tasks assessed   Lower Extremity Assessment Lower Extremity Assessment: RLE deficits/detail;Defer to PT evaluation RLE Deficits / Details: s/p THA RLE Coordination: decreased gross motor;decreased fine motor       Communication Communication Communication: No difficulties   Cognition Arousal/Alertness: Awake/alert Behavior During Therapy: WFL for tasks  assessed/performed Overall Cognitive Status: Within Functional Limits for tasks assessed                                     General Comments       Exercises Total Joint Exercises Ankle Circles/Pumps: AROM;Both;15 reps Quad Sets: AROM;Right;Strengthening;15 reps Marching in Standing: AROM;Seated;5 reps;Both Other Exercises Other Exercises: Pt instructed in falls prevention strategies, safe use of AE/DME for functional mobility and ADL management, & compensatory strategies for LB bathing and dressing tasks. Pt familiar with AE/DME from past surgeries. Has LHR and sock-aid at home. Would benefit from opportunity to trial AE with support from skilled OT.   Shoulder Instructions      Home Living Family/patient expects to be discharged to:: Private residence Living Arrangements: Spouse/significant other Available Help at Discharge: Family;Available 24 hours/day Type of Home: House Home Access: Stairs to enter [employer] of Steps: 7 ( may be able to use half wall in addition to rail on the R) Entrance Stairs-Rails: Right Home Layout: One level;Other (Comment)     Bathroom Shower/Tub: Chief Strategy Officer: Standard     Home Equipment: Bedside commode;Tub bench;Cane - single point;Crutches;Hand held shower head;Adaptive equipment Adaptive Equipment: Reacher;Sock aid        Prior Functioning/Environment Level of Independence: Independent        Comments: Pt unsure if he has a RW at home        OT Problem List: Decreased strength;Decreased coordination;Pain;Decreased range of motion;Decreased safety awareness;Impaired balance (sitting and/or standing);Decreased knowledge of use of DME or AE      OT Treatment/Interventions: Self-care/ADL training;Therapeutic exercise;Therapeutic activities;DME and/or AE instruction;Balance training;Patient/family education    OT Goals(Current goals can be found in the care plan section) Acute Rehab OT  Goals Patient Stated Goal: to go home OT Goal Formulation: With patient Time For Goal Achievement: 09/11/19 Potential to Achieve Goals: Good  OT Frequency: Min 1X/week   Barriers to D/C:            Co-evaluation PT/OT/SLP Co-Evaluation/Treatment: Yes Reason for Co-Treatment: For patient/therapist safety;Complexity of the patient's impairments (multi-system involvement);To address functional/ADL transfers PT goals addressed during session: Mobility/safety with mobility;Proper use of DME;Strengthening/ROM OT goals addressed during session: ADL's and self-care;Proper use of Adaptive equipment and DME      AM-PAC OT "6 Clicks" Daily Activity     Outcome Measure Help from another person eating meals?: None Help from another person taking care of personal grooming?: None Help from another person toileting, which includes using toliet, bedpan, or urinal?: A Little Help from another person bathing (including washing, rinsing, drying)?: A Little Help from another person to put on and taking off regular upper body clothing?: A Little Help from another person to put on and taking off regular lower body clothing?: A Little 6 Click Score: 20   End of Session Equipment Utilized During Treatment: Gait belt;Rolling walker  Activity Tolerance: Patient tolerated treatment well Patient left: in chair;with call bell/phone within reach;with family/visitor present;with SCD's reapplied;Other (comment)(With hemovac  in place and activated.)  OT Visit Diagnosis: Other abnormalities of gait and mobility (R26.89);Pain Pain - Right/Left: Right Pain - part of body: Hip;Knee                Time: 1333-1410 OT Time Calculation (min): 37 min Charges:  OT General Charges $OT Visit: 1 Visit OT Evaluation $OT Eval Moderate Complexity: 1 Mod OT Treatments $Self Care/Home Management : 8-22 mins  Rockney GheeSerenity Thursa Emme, M.S., OTR/L Ascom: [phone] 08/28/19, 3:53 PM

## 2019-08-28 NOTE — Progress Notes (Signed)
   Subjective: 1 Day Post-Op Procedure(s) (LRB): TOTAL HIP REVISION POLY EXCHANGE (Right) Patient reports pain as mild.   Patient is well, and has had no acute complaints or problems Denies any CP, SOB, ABD pain. We will continue therapy today.  Plan is to go Home after hospital stay.  Objective: Vital signs in last 24 hours: Temp:  [95.5 F (35.3 C)-99.4 F (37.4 C)] 97.4 F (36.3 C) (11/25 0800) Pulse Rate:  [59-72] 59 (11/25 0800) Resp:  [6-18] 18 (11/25 0800) BP: (100-124)/(63-90) 110/83 (11/25 0800) SpO2:  [93 %-100 %] 96 % (11/25 0800) Weight:  [90 kg] 90 kg (11/24 0833)  Intake/Output from previous day: 11/24 0701 - 11/25 0700 In: 1730.1 [I.V.:1730.1] Out: 1965 [Urine:1765; Blood:200] Intake/Output this shift: No intake/output data recorded.  Recent Labs    08/27/19 1823  HGB 14.1   Recent Labs    08/27/19 1823  WBC 10.7*  RBC 4.67  HCT 41.9  PLT 192   Recent Labs    08/27/19 1823  CREATININE 1.11   No results for input(s): LABPT, INR in the last 72 hours.  EXAM General - Patient is Alert, Appropriate and Oriented Extremity - Neurovascular intact Sensation intact distally Intact pulses distally Dorsiflexion/Plantar flexion intact No cellulitis present Compartment soft Dressing - dressing C/D/I and no drainage, Praveena intact without drainage Motor Function - intact, moving foot and toes well on exam.   Past Medical History:  Diagnosis Date  . Bursitis   . Chest pain, atypical    a. 03/2006 Cath: EF 55%, mild luminal irregularities; b. 01/2009 Lexi MV: EF 64%, fixed inferior defect w normal wall motion, likely diaphragmatic attenuation. No ischemia;  c. 2014 Ex MV: no ischemia-->c/p improved w/ PPI;  d. 01/2015 ETT: Ex time 9 mins, Max HR 141, no st/t changes; e. 01/2017 Ex MV: No isch, EF 46% (artifact - no by echo).  . CNS (central nervous system disease)    AVM s/p surgery in 1996  . GERD (gastroesophageal reflux disease)   . History of  echocardiogram    a. 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 03/2017 Echo: EF 55-60%, no rwma, Asc Ao 3.54mm, nl RV fxn, mildly dil RA.  Marland Kitchen Hyperlipidemia   . Hyperuricemia   . Osteoarthritis    hands and feet  . Plantar fasciitis   . Sinusitis   . Syncope and collapse    history  . Tendonitis     Assessment/Plan:   1 Day Post-Op Procedure(s) (LRB): TOTAL HIP REVISION POLY EXCHANGE (Right) Active Problems:   S/P revision of total hip  Estimated body mass index is 30.17 kg/m as calculated from the following:   Height as of this encounter: 5\' 8"  (1.727 m).   Weight as of this encounter: 90 kg. Advance diet Up with therapy  Pain well controlled Labs stable Vital signs are stable Possible discharge to home with home health PT today if good progress with PT  DVT Prophylaxis - Lovenox, Foot Pumps and TED hose Weight-Bearing as tolerated to right leg   T. Rachelle Hora, PA-C [place] 08/28/2019, 8:07 AM

## 2019-08-28 NOTE — TOC Benefit Eligibility Note (Signed)
Transition of Care [facility]) Benefit Eligibility Note    Patient Details  Name: Joel Galvan MRN: [id number] Date of Birth: May 09, 1946   Medication/Dose: Enoxaparin 40 mg daily x 14 days  Covered?: Yes(Generic - Enoxaparin)  Tier: Other(Tier 1)  Prescription Coverage Preferred Pharmacy: Any in-network pharmacy  Spoke with Person/Company/Phone Number:: Pura Spice, [phone]  Co-Pay: $10 - price good for 1-30 doses  Prior Approval: No     Additional Notes: Lovenox not on formulary    [place] Phone Number: 08/28/2019, 1:09 PM

## 2019-08-29 NOTE — Progress Notes (Signed)
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 PMH of chest pain, GERD, OA, HLD. Previous hip replacement was in 1998.    PT Comments    Orthostatics taken and remained normal today.  Documented in flow sheets.  Ambulated to gym for stair training with min guard Slow steady gait with verbal cues for sequencing and to increase hip and knee flexion.  Overall tolerated well with no dizziness and feels comfortable going home.   Follow Up Recommendations  Home health PT     Equipment Recommendations  Rolling walker with 5" wheels    Recommendations for Other Services       Precautions / Restrictions Precautions Precautions: Anterior Hip Precaution Booklet Issued: No Restrictions Weight Bearing Restrictions: Yes RLE Weight Bearing: Weight bearing as tolerated    Mobility  Bed Mobility Overal bed mobility: Needs Assistance Bed Mobility: Supine to Sit     Supine to sit: Min assist        Transfers Overall transfer level: Needs assistance Equipment used: Rolling walker (2 wheeled) Transfers: Sit to/from Stand Sit to Stand: Min guard            Ambulation/Gait Ambulation/Gait assistance: Min guard Gait Distance (Feet): 150 Feet Assistive device: Rolling walker (2 wheeled) Gait Pattern/deviations: Step-to pattern Gait velocity: decreased   General Gait Details: limited R hip.knee flexion with gait   Stairs Stairs: Yes Stairs assistance: Min guard Stair Management: Two rails Number of Stairs: 4     Wheelchair Mobility    Modified Rankin (Stroke Patients Only)       Balance Overall balance assessment: Needs assistance Sitting-balance support: Feet supported Sitting balance-Leahy Scale: Good       Standing balance-Leahy Scale: Fair Standing balance comment: able to  stand without UE support                            Cognition Arousal/Alertness: Awake/alert Behavior During Therapy: WFL for tasks assessed/performed Overall Cognitive Status: Within Functional Limits for tasks assessed                                        Exercises      General Comments        Pertinent Vitals/Pain Pain Assessment: 0-10 Pain Score: 8  Pain Location: R hip  to knee Pain Descriptors / Indicators: Burning Pain Intervention(s): Monitored during session;Repositioned;Limited activity within patient's tolerance    Home Living                      Prior Function            PT Goals (current goals can now be found in the care plan section) Progress towards PT goals: Progressing toward goals    Frequency    BID      PT Plan Current plan remains appropriate    Co-evaluation              AM-PAC PT "6 Clicks" Mobility   Outcome Measure  Help needed turning from your back to your side while in a flat bed without using bedrails?: A Little Help needed moving from lying on your back to sitting on the  side of a flat bed without using bedrails?: A Little Help needed moving to and from a bed to a chair (including a wheelchair)?: A Little Help needed standing up from a chair using your arms (e.g., wheelchair or bedside chair)?: A Little Help needed to walk in hospital room?: A Little Help needed climbing 3-5 steps with a railing? : A Little 6 Click Score: 18    End of Session Equipment Utilized During Treatment: Gait belt Activity Tolerance: Patient tolerated treatment well Patient left: Other (comment);with call bell/phone within reach Nurse Communication: Mobility status Pain - Right/Left: Right Pain - part of body: Hip     Time: 5400-8676 PT Time Calculation (min) (ACUTE ONLY): 29 min  Charges:  $Gait Training: 8-22 mins $Therapeutic Activity: 8-22 mins                    Chesley Noon, PTA 08/29/19,  11:26 AM

## 2019-08-29 NOTE — Progress Notes (Signed)
Pt educated on discharge instructions and Pt verbalized understanding. IV removed. VSS. Prevena applied. Wheeled to medical mall entrance and assisted into vehicle.

## 2019-08-29 NOTE — Progress Notes (Signed)
Subjective: 2 Days Post-Op Procedure(s) (LRB): TOTAL HIP REVISION POLY EXCHANGE (Right) Patient reports pain as mild.   Patient is well, and has had no acute complaints or problems Denies any CP, SOB, ABD pain. We will continue therapy today.  Plan is to go Home after hospital stay. Patient became hypotensive yesterday, received IV bolus.  Objective: Vital signs in last 24 hours: Temp:  [99.1 F (37.3 C)-100.1 F (37.8 C)] 99.1 F (37.3 C) (11/26 0723) Pulse Rate:  [60-82] 76 (11/26 0723) Resp:  [14-18] 16 (11/26 0723) BP: (109-132)/(74-79) 109/78 (11/26 0723) SpO2:  [97 %-99 %] 97 % (11/26 0723)  Intake/Output from previous day: 11/25 0701 - 11/26 0700 In: -  Out: 2400 [Urine:2400] Intake/Output this shift: Total I/O In: -  Out: 300 [Urine:300]  Recent Labs    08/27/19 1823  HGB 14.1   Recent Labs    08/27/19 1823  WBC 10.7*  RBC 4.67  HCT 41.9  PLT 192   Recent Labs    08/27/19 1823  CREATININE 1.11   No results for input(s): LABPT, INR in the last 72 hours.  EXAM General - Patient is Alert, Appropriate and Oriented Extremity - Neurovascular intact Sensation intact distally Intact pulses distally Dorsiflexion/Plantar flexion intact No cellulitis present Compartment soft Dressing - dressing C/D/I and no drainage, Praveena intact without drainage Motor Function - intact, moving foot and toes well on exam.   Past Medical History:  Diagnosis Date  . Bursitis   . Chest pain, atypical    a. 03/2006 Cath: EF 55%, mild luminal irregularities; b. 01/2009 Lexi MV: EF 64%, fixed inferior defect w normal wall motion, likely diaphragmatic attenuation. No ischemia;  c. 2014 Ex MV: no ischemia-->c/p improved w/ PPI;  d. 01/2015 ETT: Ex time 9 mins, Max HR 141, no st/t changes; e. 01/2017 Ex MV: No isch, EF 46% (artifact - no by echo).  . CNS (central nervous system disease)    AVM s/p surgery in 1996  . GERD (gastroesophageal reflux disease)   . History of  echocardiogram    a. 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 03/2017 Echo: EF 55-60%, no rwma, Asc Ao 3.53mm, nl RV fxn, mildly dil RA.  Marland Kitchen Hyperlipidemia   . Hyperuricemia   . Osteoarthritis    hands and feet  . Plantar fasciitis   . Sinusitis   . Syncope and collapse    history  . Tendonitis     Assessment/Plan:   2 Days Post-Op Procedure(s) (LRB): TOTAL HIP REVISION POLY EXCHANGE (Right) Active Problems:   S/P revision of total hip  Estimated body mass index is 30.17 kg/m as calculated from the following:   Height as of this encounter: 5\' 8"  (1.727 m).   Weight as of this encounter: 90 kg. Advance diet Up with therapy   BP 109/78 this morning. Continue to monitor BP when working with PT today. Plan for discharge home this afternoon pending progress with PT.  DVT Prophylaxis - Lovenox, Foot Pumps and TED hose Weight-Bearing as tolerated to right leg  J. Cameron Proud, PA-C [place] 08/29/2019, 8:48 AM

## 2019-08-29 NOTE — TOC Transition Note (Signed)
Transition of Care [facility]) - CM/SW Discharge Note   Patient Details  Name: DEREC MOZINGO MRN: [id number] Date of Birth: 09/27/1946  [facility] of [facility] CM/SW Contact:  Shelbie Hutching, RN Phone Number: 08/29/2019, 11:33 AM   Clinical Narrative:    Patient discharging home with home health services for PT through [facility].  Drue Novel with [facility] notified of discharge via secure email.  Patient has no DME needs.    Final next level of care: [place] Barriers to Discharge: Barriers Resolved   Patient Goals and CMS Choice Patient states their goals for this hospitalization and ongoing recovery are:: go home CMS Medicare.gov Compare Post Acute Care list provided to:: Patient Choice offered to / list presented to : Patient  Discharge Placement                       Discharge Plan and Services   Discharge Planning Services: CM Consult Post Acute Care Choice: Home Health          DME Arranged: N/A         HH Arranged: PT [place] Agency: [facility] Date [place]: 08/29/19 Time [place]: [id number] Representative spoke with at [place]: Drue Novel- emailed pt discharging  Social Determinants of Health ([place]) Interventions     Readmission Risk Interventions No flowsheet data found.

## 2019-08-29 NOTE — Progress Notes (Signed)
RN called the pt back to inform him to take lovenox injections instead of ASA. He verbalized understanding. Was educated on this medication this morning.

## 2019-08-29 NOTE — Progress Notes (Signed)
RN called the PA to confirm DVT prophylaxis at discharge. Per PA Pt should take asa 325 daily. RN called the pt to inform him and he verbalized understanding.

## 2019-09-01 LAB — AEROBIC/ANAEROBIC CULTURE W GRAM STAIN (SURGICAL/DEEP WOUND)
Culture: NO GROWTH
Gram Stain: NONE SEEN

## 2019-10-03 ENCOUNTER — Encounter: Payer: Self-pay | Admitting: *Deleted

## 2020-02-07 ENCOUNTER — Ambulatory Visit: Payer: Medicare Other | Admitting: Cardiovascular Disease

## 2020-02-27 ENCOUNTER — Other Ambulatory Visit: Payer: Self-pay

## 2020-02-27 ENCOUNTER — Ambulatory Visit: Payer: Medicare PPO | Admitting: Cardiovascular Disease

## 2020-02-27 VITALS — BP 130/88 | HR 63 | Ht 68.0 in | Wt 205.5 lb

## 2020-02-27 DIAGNOSIS — R072 Precordial pain: Secondary | ICD-10-CM

## 2020-02-27 DIAGNOSIS — E785 Hyperlipidemia, unspecified: Secondary | ICD-10-CM

## 2020-02-27 MED ORDER — METOPROLOL TARTRATE 50 MG PO TABS: ORAL_TABLET | ORAL | 0 refills | Status: DC

## 2020-02-27 NOTE — Progress Notes (Signed)
Cardiology Office Note   Date:  02/27/2020   ID:  Joel Galvan, DOB 17-May-1946, MRN [id number]  PCP:  Danella Penton, MD  Cardiologist:   Lorine Bears, MD   Chief Complaint  Patient presents with  . office visit    Pt states having a Hx of Left pain in chest, recently has felt pain in the Right side of chest/ tired easily then usual/ fatigue. Meds verbally reviewed w/ pt.       History of Present Illness: Joel Galvan is a 74 y.o. male who presents for a follow-up visit regarding chronic atypical chest pain. He had previous cardiac catheterization in 2007 which showed minor luminal irregularities without evidence of obstructive disease. He had multiple nuclear stress test since then for intermittent chest pain with negative results.   Some of his symptoms were felt to be GI in nature.  Most recent cardiac evaluation was in 2018.  Treadmill nuclear stress test at that time showed no evidence of ischemia. Ejection fraction was mildly depressed but thought to be due to an artifact. He had an echocardiogram done which showed normal LV systolic function with no significant valvular abnormalities.   He had revision of right hip replacement in November and he has recovered slowly from that.  He did gain weight around that time.  He is time to get back exercising and biking.  He continues to have intermittent episodes of chest pain and had one episode 1 week ago.  It was described as aching on the left side that is 1 slightly to the substernal area and the right side.  It lasted for about 10 minutes.  Past Medical History:  Diagnosis Date  . Bursitis   . Chest pain, atypical    a. 03/2006 Cath: EF 55%, mild luminal irregularities; b. 01/2009 Lexi MV: EF 64%, fixed inferior defect w normal wall motion, likely diaphragmatic attenuation. No ischemia;  c. 2014 Ex MV: no ischemia-->c/p improved w/ PPI;  d. 01/2015 ETT: Ex time 9 mins, Max HR 141, no st/t changes; e. 01/2017 Ex MV: No isch, EF  46% (artifact - no by echo).  . CNS (central nervous system disease)    AVM s/p surgery in 1996  . GERD (gastroesophageal reflux disease)   . History of echocardiogram    a. 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 03/2017 Echo: EF 55-60%, no rwma, Asc Ao 3.37mm, nl RV fxn, mildly dil RA.  Marland Kitchen Hyperlipidemia   . Hyperuricemia   . Osteoarthritis    hands and feet  . Plantar fasciitis   . Sinusitis   . Syncope and collapse    history  . Tendonitis     Past Surgical History:  Procedure Laterality Date  . APPENDECTOMY  1958  . BALLOON SINUPLASTY  11/29/2017   Dr Wyn Forster office  . CARDIAC CATHETERIZATION  2007   cone  . CRANIOTOMY  1996   for AVM  . FRACTURE SURGERY Left    arm  . HEMORRHOID SURGERY    . KNEE ARTHROSCOPY WITH MENISCAL REPAIR Left 10/04/2018   Procedure: KNEE ARTHROSCOPY WITH MENISCAL REPAIR;  Surgeon: Signa Kell, MD;  Location: [facility];  Service: Orthopedics;  Laterality: Left;  . 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 ROTATOR CUFF REPAIR Right 10/08/2014  . TONSILLECTOMY  1966  . TOTAL HIP REVISION Right 08/27/2019   Procedure: TOTAL HIP REVISION POLY EXCHANGE;  Surgeon: Kennedy Bucker, MD;  Location: [facility];  Service: Orthopedics;  Laterality: Right;     Current Outpatient Medications  Medication Sig Dispense Refill  . aspirin 325 MG tablet Take 325 mg by mouth daily as needed (pain.).    Marland Kitchen atorvastatin (LIPITOR) 10 MG tablet Take 5 mg by mouth every Monday, Wednesday, and Friday. In the morning    . ezetimibe (ZETIA) 10 MG tablet Take 10 mg by mouth daily. In the mornings.    Marland Kitchen HYDROcodone-acetaminophen (NORCO/VICODIN) 5-325 MG tablet Take 1-2 tablets by mouth every 4 (four) hours as needed for moderate pain (pain score 4-6). 40 tablet 0  . naproxen (NAPROSYN) 500 MG tablet Take 500  mg by mouth 2 (two) times daily as needed (severe pain.).    Marland Kitchen naproxen sodium (ALEVE) 220 MG tablet Take 440 mg by mouth 2 (two) times daily as needed (for pain.).    Marland Kitchen pantoprazole (PROTONIX) 40 MG tablet TAKE 1 TABLET (40 MG TOTAL) BY MOUTH DAILY. (Patient taking differently: Take 40 mg by mouth daily. ) 30 tablet 3  . triamcinolone (NASACORT ALLERGY 24HR) 55 MCG/ACT AERO nasal inhaler Place 2 sprays into the nose daily as needed (allergies.).     Marland Kitchen tadalafil (CIALIS) 5 MG tablet Take 5 mg by mouth daily as needed for erectile dysfunction.     No current facility-administered medications for this visit.    Allergies:   Methocarbamol and Prednisone    Social History:  The patient  reports that he has never smoked. He has never used smokeless tobacco. He reports current alcohol use. He reports that he does not use drugs.   Family History:  The patient's family history includes Heart attack (age of onset: 75) in his father.    ROS:  Please see the history of present illness.   Otherwise, review of systems are positive for none.   All other systems are reviewed and negative.    PHYSICAL EXAM: VS:  BP 130/88 (BP Location: Left Arm, Patient Position: Sitting, Cuff Size: Normal)   Pulse 63   Ht 5\' 8"  (1.727 m)   Wt 205 lb 8 oz (93.2 kg)   SpO2 98%   BMI 31.25 kg/m  , BMI Body mass index is 31.25 kg/m. GEN: Well nourished, well developed, in no acute distress  HEENT: normal  Neck: no JVD, carotid bruits, or masses Cardiac: RRR; no murmurs, rubs, or gallops,no edema  Respiratory:  clear to auscultation bilaterally, normal work of breathing GI: soft, nontender, nondistended, + BS MS: no deformity or atrophy  Skin: warm and dry, no rash Neuro:  Strength and sensation are intact Psych: euthymic mood, full affect   EKG:  EKG is ordered today. EKG showed normal sinus rhythm with minimal LVH  Recent Labs: 08/21/2019: BUN 16; Potassium 4.4; Sodium 139 08/27/2019: Creatinine, Ser  1.11; Hemoglobin 14.1; Platelets 192    Lipid Panel    Component Value Date/Time   CHOL 160 12/11/2014 0825   TRIG 88 12/11/2014 0825   HDL 52 12/11/2014 0825   CHOLHDL 3.1 12/11/2014 0825   CHOLHDL 3.6 Ratio 11/05/2010 2116   VLDL 19 11/05/2010 2116   LDLCALC 90 12/11/2014 0825      Wt Readings from Last 3 Encounters:  02/27/20 205 lb 8 oz (93.2 kg)  08/27/19 198 lb 6.6 oz (90 kg)  08/21/19 198 lb 4.8 oz (89.9 kg)  No flowsheet data found.    ASSESSMENT AND PLAN:  1.  Chest pain: Multiple risk factors for coronary artery disease including age, gender and hyperlipidemia.  With a recent episode 1 week ago is concerning and I recommend evaluation with CTA of the coronary arteries with FFR.  2. Hyperlipidemia: Currently on low dose atorvastatin 3 times a week and Zetia.  I reviewed most recent lipid profile which showed an LDL of 92.  If CTA shows significant atherosclerosis, we might need to be more aggressive with this.   Disposition:   FU with me in 1 year  Signed,  Kathlyn Sacramento, MD  02/27/2020 9:28 AM    [facility]

## 2020-02-27 NOTE — Patient Instructions (Signed)
Medication Instructions:  Your physician recommends that you continue on your current medications as directed. Please refer to the Current Medication list given to you today.  *If you need a refill on your cardiac medications before your next appointment, please call your pharmacy*   Lab Work: Your physician recommends that you return for lab work 1 week prior to Cardiac CT at the medical mall. No appt is needed. Hours are M-F 7AM- 6 PM.  If you have labs (blood work) drawn today and your tests are completely normal, you will receive your results only by: Marland Kitchen MyChart Message (if you have MyChart) OR . A paper copy in the mail If you have any lab test that is abnormal or we need to change your treatment, we will call you to review the results.   Testing/Procedures: 1- Your cardiac CT will be scheduled at one of the below locations:   [facility] [address] [place], [place] [address] [phone] [address] [place], [place] [address] [phone]  If scheduled at [facility], please arrive at the [facility] main entrance of [facility] 30 minutes prior to test start time. Proceed to the [facility] Radiology Department (first floor) to check-in and test prep.  If scheduled at [facility], please arrive 15 mins early for check-in and test prep.  Please follow these instructions carefully (unless otherwise directed):  Hold all erectile dysfunction medications at least 3 days (72 hrs) prior to test.  On the Night Before the Test: . Be sure to Drink plenty of water. . Do not consume any caffeinated/decaffeinated beverages or chocolate 12 hours prior to your test. . Do not take any antihistamines 12 hours prior to your test.  On the Day of the Test: . Drink plenty of water. Do not drink any water within one hour of the test. . Do not eat any food 4 hours  prior to the test. . You may take your regular medications prior to the test.  . Take metoprolol (Lopressor) two hours prior to test. (One time only dose)       After the Test: . Drink plenty of water. . After receiving IV contrast, you may experience a mild flushed feeling. This is normal. . On occasion, you may experience a mild rash up to 24 hours after the test. This is not dangerous. If this occurs, you can take Benadryl 25 mg and increase your fluid intake. . If you experience trouble breathing, this can be serious. If it is severe call 911 IMMEDIATELY. If it is mild, please call our office. . If you take any of these medications: Glipizide/Metformin, Avandament, Glucavance, please do not take 48 hours after completing test unless otherwise instructed.   Once we have confirmed authorization from your insurance company, we will call you to set up a date and time for your test.   For non-scheduling related questions, please contact the cardiac imaging nurse navigator should you have any questions/concerns: Marchia Bond, Cardiac Imaging Nurse Navigator Burley Saver, Interim Cardiac Imaging Nurse [facility] Direct Office Dial: [phone]   For scheduling needs, including cancellations and rescheduling, please call [phone].      Follow-Up: At [facility], you and your health needs are our priority.  As part of our continuing mission to provide you with exceptional heart care, we have created designated Provider Care Teams.  These  Care Teams include your primary Cardiologist (physician) and Advanced Practice Providers (APPs -  Physician Assistants and Nurse Practitioners) who all work together to provide you with the care you need, when you need it.  We recommend signing up for the patient portal called "MyChart".  Sign up information is provided on this After Visit Summary.  MyChart is used to connect with patients for Virtual Visits  (Telemedicine).  Patients are able to view lab/test results, encounter notes, upcoming appointments, etc.  Non-urgent messages can be sent to your provider as well.   To learn more about what you can do with MyChart, go to [email or web address].    Your next appointment:   1 year(s)  The format for your next appointment:   In Person  Provider:    You may see Kathlyn Sacramento, MD or one of the following Advanced Practice Providers on your designated Care Team:    Murray Hodgkins, NP  Christell Faith, PA-C  Marrianne Mood, PA-C

## 2020-03-31 ENCOUNTER — Telehealth (HOSPITAL_COMMUNITY): Payer: Self-pay | Admitting: *Deleted

## 2020-03-31 NOTE — Telephone Encounter (Signed)

## 2020-04-01 ENCOUNTER — Other Ambulatory Visit
Admission: RE | Admit: 2020-04-01 | Discharge: 2020-04-01 | Disposition: A | Discharge status: Home / Self Care | Payer: Medicare PPO | Attending: Cardiovascular Disease | Admitting: Cardiovascular Disease

## 2020-04-01 DIAGNOSIS — R072 Precordial pain: Secondary | ICD-10-CM | POA: Diagnosis present

## 2020-04-01 LAB — BASIC METABOLIC PANEL
Anion gap: 7 (ref 5–15)
BUN: 13 mg/dL (ref 8–23)
CO2: 26 mmol/L (ref 22–32)
Calcium: 9.1 mg/dL (ref 8.9–10.3)
Chloride: 107 mmol/L (ref 98–111)
Creatinine, Ser: 1.2 mg/dL (ref 0.61–1.24)
GFR calc Af Amer: >60 mL/min (ref >60)
GFR calc non Af Amer: 60 mL/min — ABNORMAL LOW (ref >60)
Glucose, Bld: 116 mg/dL — ABNORMAL HIGH (ref 70–99)
Potassium: 4.1 mmol/L (ref 3.5–5.1)
Sodium: 140 mmol/L (ref 135–145)

## 2020-04-02 ENCOUNTER — Other Ambulatory Visit: Payer: Self-pay

## 2020-04-02 ENCOUNTER — Ambulatory Visit
Admission: RE | Admit: 2020-04-02 | Discharge: 2020-04-02 | Disposition: A | Discharge status: Home / Self Care | Payer: Medicare PPO | Source: Ambulatory Visit | Attending: Cardiovascular Disease | Admitting: Cardiovascular Disease

## 2020-04-02 DIAGNOSIS — R072 Precordial pain: Secondary | ICD-10-CM

## 2020-04-02 LAB — POCT I-STAT CREATININE: Creatinine, Ser: 1.2 mg/dL (ref 0.61–1.24)

## 2020-04-02 MED ORDER — NITROGLYCERIN 0.4 MG SL SUBL
0.8000 mg | SUBLINGUAL_TABLET | Freq: Once | SUBLINGUAL | Status: AC
  Administered 2020-04-02: 0.8 mg via SUBLINGUAL

## 2020-04-02 MED ORDER — IOHEXOL 350 MG/ML SOLN
100.0000 mL | Freq: Once | INTRAVENOUS | Status: AC | PRN
  Administered 2020-04-02: 100 mL via INTRAVENOUS

## 2020-04-02 NOTE — Progress Notes (Signed)
Patient tolerated procedure well. Ambulate w/o difficulty. Sitting in chair drinking. No needs. All questions answered. ABC intact. Discharge from procedure area w/o issues. 

## 2020-07-20 ENCOUNTER — Ambulatory Visit: Payer: Medicare PPO | Attending: Internal Medicine

## 2020-07-20 DIAGNOSIS — Z23 Encounter for immunization: Secondary | ICD-10-CM

## 2020-07-20 NOTE — Progress Notes (Signed)
   Covid-19 Vaccination Clinic  Name:  Joel Galvan    MRN: [id number] DOB: [birth year]  07/20/2020  [name] was observed post Covid-19 immunization for 15 minutes without incident. He was provided with Vaccine Information Sheet and instruction to access the V-Safe system.   [name] was instructed to call 911 with any severe reactions post vaccine: Marland Kitchen Difficulty breathing  . Swelling of face and throat  . A fast heartbeat  . A bad rash all over body  . Dizziness and weakness

## 2020-10-05 ENCOUNTER — Other Ambulatory Visit: Payer: Medicare PPO

## 2020-10-05 DIAGNOSIS — Z20822 Contact with and (suspected) exposure to covid-19: Secondary | ICD-10-CM

## 2020-10-07 LAB — NOVEL CORONAVIRUS, NAA: SARS-CoV-2, NAA: NOT DETECTED

## 2020-10-07 LAB — SARS-COV-2, NAA 2 DAY TAT

## 2021-01-29 ENCOUNTER — Other Ambulatory Visit: Payer: Self-pay

## 2021-01-29 ENCOUNTER — Ambulatory Visit: Payer: Medicare PPO | Attending: Internal Medicine

## 2021-01-29 DIAGNOSIS — Z23 Encounter for immunization: Secondary | ICD-10-CM

## 2021-01-29 MED ORDER — PFIZER-BIONT COVID-19 VAC-TRIS 30 MCG/0.3ML IM SUSP
INTRAMUSCULAR | 0 refills | Status: DC
  Filled 2021-01-29: qty 0.3, 10d supply, fill #0

## 2021-01-29 NOTE — Progress Notes (Signed)
   Covid-19 Vaccination Clinic  Name:  Joel Galvan    MRN: [id number] DOB: [birth year]  01/29/2021  [name] was observed post Covid-19 immunization for 15 minutes without incident. He was provided with Vaccine Information Sheet and instruction to access the V-Safe system.   [name] was instructed to call 911 with any severe reactions post vaccine: Marland Kitchen Difficulty breathing  . Swelling of face and throat  . A fast heartbeat  . A bad rash all over body  . Dizziness and weakness   Immunizations Administered    Name Date Dose VIS Date Route   PFIZER Comrnaty(Gray TOP) Covid-19 Vaccine 01/29/2021 10:06 AM 0.3 mL 09/10/2020 Intramuscular   Manufacturer: [employer], [employer]   Lot: [id number]   NDC: [id number]

## 2021-02-23 ENCOUNTER — Other Ambulatory Visit: Payer: Self-pay

## 2021-02-23 ENCOUNTER — Ambulatory Visit: Payer: Medicare PPO | Admitting: Cardiovascular Disease

## 2021-02-23 ENCOUNTER — Encounter: Payer: Self-pay | Admitting: Cardiovascular Disease

## 2021-02-23 VITALS — BP 108/70 | HR 59 | Ht 68.0 in | Wt 196.1 lb

## 2021-02-23 DIAGNOSIS — E785 Hyperlipidemia, unspecified: Secondary | ICD-10-CM

## 2021-02-23 DIAGNOSIS — I2510 Atherosclerotic heart disease of native coronary artery without angina pectoris: Secondary | ICD-10-CM

## 2021-02-23 MED ORDER — ATORVASTATIN CALCIUM 10 MG PO TABS: 5.0000 mg | ORAL_TABLET | Freq: Every day | ORAL | 3 refills | Status: AC

## 2021-02-23 NOTE — Progress Notes (Signed)
Cardiology Office Note   Date:  02/23/2021   ID:  Joel Galvan, DOB Jun 22, 1946, MRN [id number]  PCP:  Danella Penton, MD  Cardiologist:   Lorine Bears, MD   Chief Complaint  Patient presents with  . Other    12 month f/u no complaints today. Meds reviewed verbally with pt.      History of Present Illness: Joel Galvan is a 75 y.o. male who presents for a follow-up visit regarding chronic atypical chest pain and mild coronary atherosclerosis. He had previous cardiac catheterization in 2007 which showed minor luminal irregularities without evidence of obstructive disease. He had multiple nuclear stress test since then for intermittent chest pain with negative results.   Some of his symptoms were felt to be GI in nature.  Most recent cardiac evaluation was in 2018.  Treadmill nuclear stress test at that time showed no evidence of ischemia. Ejection fraction was mildly depressed but thought to be due to an artifact. He had an echocardiogram done which showed normal LV systolic function with no significant valvular abnormalities.   He had previous right hip replacement.  Cardiac CTA in July 2021 showed calcium score of 85.7 with evidence of mild calcified plaque in the proximal LAD causing mild stenosis and no evidence of obstructive disease.  He has been doing well with no recent chest pain, shortness of breath or palpitations.  Past Medical History:  Diagnosis Date  . Bursitis   . Chest pain, atypical    a. 03/2006 Cath: EF 55%, mild luminal irregularities; b. 01/2009 Lexi MV: EF 64%, fixed inferior defect w normal wall motion, likely diaphragmatic attenuation. No ischemia;  c. 2014 Ex MV: no ischemia-->c/p improved w/ PPI;  d. 01/2015 ETT: Ex time 9 mins, Max HR 141, no st/t changes; e. 01/2017 Ex MV: No isch, EF 46% (artifact - no by echo).  . CNS (central nervous system disease)    AVM s/p surgery in 1996  . GERD (gastroesophageal reflux disease)   . History of  echocardiogram    a. 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 03/2017 Echo: EF 55-60%, no rwma, Asc Ao 3.4mm, nl RV fxn, mildly dil RA.  Marland Kitchen Hyperlipidemia   . Hyperuricemia   . Osteoarthritis    hands and feet  . Plantar fasciitis   . Sinusitis   . Syncope and collapse    history  . Tendonitis     Past Surgical History:  Procedure Laterality Date  . APPENDECTOMY  1958  . BALLOON SINUPLASTY  11/29/2017   Dr Wyn Forster office  . CARDIAC CATHETERIZATION  2007   cone  . CRANIOTOMY  1996   for AVM  . FRACTURE SURGERY Left    arm  . HEMORRHOID SURGERY    . KNEE ARTHROSCOPY WITH MENISCAL REPAIR Left 10/04/2018   Procedure: KNEE ARTHROSCOPY WITH MENISCAL REPAIR;  Surgeon: Signa Kell, MD;  Location: [facility];  Service: Orthopedics;  Laterality: Left;  . 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 ROTATOR CUFF REPAIR Right 10/08/2014  . TONSILLECTOMY  1966  . TOTAL HIP REVISION Right 08/27/2019   Procedure: TOTAL HIP REVISION POLY EXCHANGE;  Surgeon: Kennedy Bucker, MD;  Location: [facility];  Service: Orthopedics;  Laterality: Right;     Current Outpatient Medications  Medication Sig Dispense Refill  .  atorvastatin (LIPITOR) 10 MG tablet Take 5 mg by mouth every Monday, Wednesday, and Friday. In the morning    . COVID-19 mRNA Vac-TriS, Pfizer, (PFIZER-BIONT COVID-19 VAC-TRIS) SUSP injection Inject into the muscle. 0.3 mL 0  . ezetimibe (ZETIA) 10 MG tablet Take 10 mg by mouth daily. In the mornings.    . naproxen (NAPROSYN) 500 MG tablet Take 500 mg by mouth 2 (two) times daily as needed (severe pain.).    Marland Kitchen pantoprazole (PROTONIX) 40 MG tablet TAKE 1 TABLET (40 MG TOTAL) BY MOUTH DAILY. 30 tablet 3  . tadalafil (CIALIS) 5 MG tablet Take 5 mg by mouth daily as needed for erectile dysfunction.    . triamcinolone  (NASACORT) 55 MCG/ACT AERO nasal inhaler Place 2 sprays into the nose daily as needed (allergies.).      No current facility-administered medications for this visit.    Allergies:   Methocarbamol and Prednisone    Social History:  The patient  reports that he has never smoked. He has never used smokeless tobacco. He reports current alcohol use. He reports that he does not use drugs.   Family History:  The patient's family history includes Heart attack (age of onset: 96) in his father.    ROS:  Please see the history of present illness.   Otherwise, review of systems are positive for none.   All other systems are reviewed and negative.    PHYSICAL EXAM: VS:  BP 108/70 (BP Location: Left Arm, Patient Position: Sitting, Cuff Size: Normal)   Pulse (!) 59   Ht 5\' 8"  (1.727 m)   Wt 196 lb 2 oz (89 kg)   SpO2 98%   BMI 29.82 kg/m  , BMI Body mass index is 29.82 kg/m. GEN: Well nourished, well developed, in no acute distress  HEENT: normal  Neck: no JVD, carotid bruits, or masses Cardiac: RRR; no murmurs, rubs, or gallops,no edema  Respiratory:  clear to auscultation bilaterally, normal work of breathing GI: soft, nontender, nondistended, + BS MS: no deformity or atrophy  Skin: warm and dry, no rash Neuro:  Strength and sensation are intact Psych: euthymic mood, full affect   EKG:  EKG is ordered today. EKG showed normal sinus rhythm with minimal LVH  Recent Labs: 04/01/2020: BUN 13; Potassium 4.1; Sodium 140 04/02/2020: Creatinine, Ser 1.20    Lipid Panel    Component Value Date/Time   CHOL 160 12/11/2014 0825   TRIG 88 12/11/2014 0825   HDL 52 12/11/2014 0825   CHOLHDL 3.1 12/11/2014 0825   CHOLHDL 3.6 Ratio 11/05/2010 2116   VLDL 19 11/05/2010 2116   LDLCALC 90 12/11/2014 0825      Wt Readings from Last 3 Encounters:  02/23/21 196 lb 2 oz (89 kg)  02/27/20 205 lb 8 oz (93.2 kg)  08/27/19 198 lb 6.6 oz (90 kg)     No flowsheet data found.    ASSESSMENT AND  PLAN:  1.  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  Currently with no anginal symptoms.  Recommend aggressive medical therapy.  Calcium score was below 100, thus no need for aspirin.   2. Hyperlipidemia: I reviewed most recent lipid profile done in September of last year which showed a triglyceride of 79 and an LDL of 94.  Given the presence of coronary atherosclerosis, I recommend a target LDL of less than 70.  Thus, I increased atorvastatin to 5 mg  once daily from 3 times a week.  Continue Zetia.   Disposition:   FU with me in 1 year  Signed,  Lorine Bears, MD  02/23/2021 3:20 PM    [facility]

## 2021-02-23 NOTE — Patient Instructions (Signed)
Medication Instructions:  Your physician has recommended you make the following change in your medication:   INCREASE Atorvastatin to 5 mg daily. An Rx has been sent to your pharmacy.  *If you need a refill on your cardiac medications before your next appointment, please call your pharmacy*   Lab Work: None ordered If you have labs (blood work) drawn today and your tests are completely normal, you will receive your results only by: Marland Kitchen MyChart Message (if you have MyChart) OR . A paper copy in the mail If you have any lab test that is abnormal or we need to change your treatment, we will call you to review the results.   Testing/Procedures: None ordered   Follow-Up: At [facility], you and your health needs are our priority.  As part of our continuing mission to provide you with exceptional heart care, we have created designated Provider Care Teams.  These Care Teams include your primary Cardiologist (physician) and Advanced Practice Providers (APPs -  Physician Assistants and Nurse Practitioners) who all work together to provide you with the care you need, when you need it.  We recommend signing up for the patient portal called "MyChart".  Sign up information is provided on this After Visit Summary.  MyChart is used to connect with patients for Virtual Visits (Telemedicine).  Patients are able to view lab/test results, encounter notes, upcoming appointments, etc.  Non-urgent messages can be sent to your provider as well.   To learn more about what you can do with MyChart, go to [email or web address].    Your next appointment:   Your physician wants you to follow-up in: 1 year You will receive a reminder letter in the mail two months in advance. If you don't receive a letter, please call our office to schedule the follow-up appointment.   The format for your next appointment:   In Person  Provider:   You may see Lorine Bears, MD or one of the following Advanced Practice  Providers on your designated Care Team:    Nicolasa Ducking, NP  Eula Listen, PA-C  Marisue Ivan, PA-C  Cadence [place], [place]  Gillian Shields, NP    Other Instructions N/A

## 2021-05-17 ENCOUNTER — Telehealth: Payer: Self-pay | Admitting: Cardiovascular Disease

## 2021-05-17 NOTE — Telephone Encounter (Signed)
Pt c/o of Chest Pain: STAT if CP now or developed within 24 hours  1. Are you having CP right now? No, it comes and goes   2. Are you experiencing any other symptoms (ex. SOB, nausea, vomiting, sweating)? Swimminess, weakness   3. How long have you been experiencing CP? Going on 4 weeks - has been concerning   4. Is your CP continuous or coming and going? Coming and going   5. Have you taken Nitroglycerin? No  ?  Transferred to [employer]

## 2021-05-17 NOTE — Telephone Encounter (Signed)
Incoming triage call received.  Patient calling to report chest pain and "swimminess" that started 4 weeks ago. He denies pre-syncope, syncope, sob. Symptoms are intermittent. He did have an episode this morning. Patient is currently asymptomatic. Symptoms are not precipitated by exertion. He unable to provide any vital signs.He is very active and bikes regularly.  Patient sts that had been doing well for a while. His symptoms started abou a month ago after a bike in extreme heat.  He reports staying well hydrated. He is requesting to be seen. He has also placed a call to his pcp. Appt scheduled on 8/18 @ 11:20 am with Ward Givens, NP.  Adv the patient that in the interim he is to seek emergent care for worsening symptoms. Patient verbalized understanding and voiced appreciation for the assistance.

## 2021-05-20 ENCOUNTER — Ambulatory Visit: Payer: Medicare PPO | Admitting: Nurse Practitioner

## 2021-05-20 ENCOUNTER — Other Ambulatory Visit: Payer: Self-pay | Admitting: Internal Medicine

## 2021-05-20 ENCOUNTER — Encounter: Payer: Self-pay | Admitting: Nurse Practitioner

## 2021-05-20 ENCOUNTER — Other Ambulatory Visit: Payer: Self-pay

## 2021-05-20 VITALS — BP 130/80 | HR 58 | Ht 68.0 in | Wt 198.5 lb

## 2021-05-20 DIAGNOSIS — R42 Dizziness and giddiness: Secondary | ICD-10-CM

## 2021-05-20 DIAGNOSIS — R5381 Other malaise: Secondary | ICD-10-CM

## 2021-05-20 DIAGNOSIS — I610 Nontraumatic intracerebral hemorrhage in hemisphere, subcortical: Secondary | ICD-10-CM

## 2021-05-20 DIAGNOSIS — I2510 Atherosclerotic heart disease of native coronary artery without angina pectoris: Secondary | ICD-10-CM

## 2021-05-20 DIAGNOSIS — R519 Headache, unspecified: Secondary | ICD-10-CM

## 2021-05-20 DIAGNOSIS — E782 Mixed hyperlipidemia: Secondary | ICD-10-CM | POA: Diagnosis not present
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
## 2021-05-20 NOTE — Patient Instructions (Signed)
Medication Instructions:  No changes at this time.  *If you need a refill on your cardiac medications before your next appointment, please call your pharmacy*   Lab Work: None  If you have labs (blood work) drawn today and your tests are completely normal, you will receive your results only by: MyChart Message (if you have MyChart) OR A paper copy in the mail If you have any lab test that is abnormal or we need to change your treatment, we will call you to review the results.   Testing/Procedures: None    Follow-Up: At [facility], you and your health needs are our priority.  As part of our continuing mission to provide you with exceptional heart care, we have created designated Provider Care Teams.  These Care Teams include your primary Cardiologist (physician) and Advanced Practice Providers (APPs -  Physician Assistants and Nurse Practitioners) who all work together to provide you with the care you need, when you need it.  Your next appointment:   3 month(s)  The format for your next appointment:   In Person  Provider:   Muhammad Arida, MD  

## 2021-05-21 ENCOUNTER — Ambulatory Visit
Admission: RE | Admit: 2021-05-21 | Discharge: 2021-05-21 | Disposition: A | Discharge status: Home / Self Care | Payer: Medicare PPO | Source: Ambulatory Visit | Attending: Internal Medicine | Admitting: Internal Medicine

## 2021-05-21 DIAGNOSIS — I610 Nontraumatic intracerebral hemorrhage in hemisphere, subcortical: Secondary | ICD-10-CM | POA: Insufficient documentation

## 2021-05-21 MED ORDER — GADOBUTROL 1 MMOL/ML IV SOLN
7.5000 mL | Freq: Once | INTRAVENOUS | Status: AC | PRN
  Administered 2021-05-21: 7.5 mL via INTRAVENOUS

## 2021-08-10 ENCOUNTER — Other Ambulatory Visit: Payer: Self-pay

## 2021-08-10 ENCOUNTER — Ambulatory Visit: Payer: Medicare PPO | Admitting: Cardiovascular Disease

## 2021-08-10 ENCOUNTER — Encounter: Payer: Self-pay | Admitting: Cardiovascular Disease

## 2021-08-10 VITALS — BP 120/70 | HR 66 | Ht 68.0 in | Wt 205.1 lb

## 2021-08-10 DIAGNOSIS — E785 Hyperlipidemia, unspecified: Secondary | ICD-10-CM | POA: Diagnosis not present

## 2021-08-10 DIAGNOSIS — I2510 Atherosclerotic heart disease of native coronary artery without angina pectoris: Secondary | ICD-10-CM | POA: Diagnosis not present
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
## 2021-08-10 NOTE — Patient Instructions (Signed)
Medication Instructions:  Your physician recommends that you continue on your current medications as directed. Please refer to the Current Medication list given to you today.  *If you need a refill on your cardiac medications before your next appointment, please call your pharmacy*   Lab Work: None ordered  If you have labs (blood work) drawn today and your tests are completely normal, you will receive your results only by: MyChart Message (if you have MyChart) OR A paper copy in the mail If you have any lab test that is abnormal or we need to change your treatment, we will call you to review the results.   Testing/Procedures: None ordered   Follow-Up: At [facility], you and your health needs are our priority.  As part of our continuing mission to provide you with exceptional heart care, we have created designated Provider Care Teams.  These Care Teams include your primary Cardiologist (physician) and Advanced Practice Providers (APPs -  Physician Assistants and Nurse Practitioners) who all work together to provide you with the care you need, when you need it.  We recommend signing up for the patient portal called "MyChart".  Sign up information is provided on this After Visit Summary.  MyChart is used to connect with patients for Virtual Visits (Telemedicine).  Patients are able to view lab/test results, encounter notes, upcoming appointments, etc.  Non-urgent messages can be sent to your provider as well.   To learn more about what you can do with MyChart, go to [email or web address].    Your next appointment:   Your physician wants you to follow-up in: 6 months You will receive a reminder letter in the mail two months in advance. If you don't receive a letter, please call our office to schedule the follow-up appointment.   The format for your next appointment:   In Person  Provider:   You may see Lorine Bears, MD or one of the following Advanced Practice Providers on your  designated Care Team:   Nicolasa Ducking, NP Eula Listen, PA-C Cadence Fransico Michael, [place] 1}    Other Instructions N/A

## 2021-10-22 ENCOUNTER — Other Ambulatory Visit: Payer: Self-pay | Admitting: Neurosurgery

## 2021-10-22 DIAGNOSIS — Z8781 Personal history of (healed) traumatic fracture: Secondary | ICD-10-CM

## 2021-10-22 DIAGNOSIS — M5412 Radiculopathy, cervical region: Secondary | ICD-10-CM

## 2021-11-09 ENCOUNTER — Ambulatory Visit
Admission: RE | Admit: 2021-11-09 | Discharge: 2021-11-09 | Disposition: A | Discharge status: Home / Self Care | Payer: Medicare PPO | Source: Ambulatory Visit | Attending: Neurosurgery | Admitting: Neurosurgery

## 2021-11-09 ENCOUNTER — Other Ambulatory Visit: Payer: Self-pay

## 2021-11-09 DIAGNOSIS — M5412 Radiculopathy, cervical region: Secondary | ICD-10-CM | POA: Insufficient documentation

## 2021-11-09 DIAGNOSIS — Z8781 Personal history of (healed) traumatic fracture: Secondary | ICD-10-CM | POA: Insufficient documentation

## 2022-01-10 ENCOUNTER — Observation Stay
Admission: EM | Admit: 2022-01-10 | Discharge: 2022-01-10 | Disposition: A | Discharge status: Home / Self Care | Payer: Medicare PPO | Attending: Internal Medicine | Admitting: Internal Medicine

## 2022-01-10 ENCOUNTER — Encounter: Payer: Self-pay | Admitting: Emergency Medicine

## 2022-01-10 ENCOUNTER — Other Ambulatory Visit: Payer: Self-pay

## 2022-01-10 ENCOUNTER — Emergency Department: Payer: Medicare PPO

## 2022-01-10 ENCOUNTER — Encounter
Admission: EM | Disposition: A | Discharge status: Home / Self Care | Payer: Self-pay | Source: Home / Self Care | Attending: Emergency Medicine

## 2022-01-10 ENCOUNTER — Observation Stay (HOSPITAL_BASED_OUTPATIENT_CLINIC_OR_DEPARTMENT_OTHER)
Admit: 2022-01-10 | Discharge: 2022-01-10 | Disposition: A | Discharge status: Home / Self Care | Payer: Medicare PPO | Attending: Internal Medicine | Admitting: Internal Medicine

## 2022-01-10 ENCOUNTER — Observation Stay: Payer: Medicare PPO | Admitting: Anesthesiology

## 2022-01-10 ENCOUNTER — Observation Stay: Admit: 2022-01-10 | Payer: Medicare PPO

## 2022-01-10 DIAGNOSIS — R17 Unspecified jaundice: Secondary | ICD-10-CM | POA: Insufficient documentation

## 2022-01-10 DIAGNOSIS — R55 Syncope and collapse: Secondary | ICD-10-CM | POA: Diagnosis present

## 2022-01-10 DIAGNOSIS — Z96641 Presence of right artificial hip joint: Secondary | ICD-10-CM | POA: Diagnosis not present

## 2022-01-10 DIAGNOSIS — K219 Gastro-esophageal reflux disease without esophagitis: Secondary | ICD-10-CM | POA: Diagnosis not present

## 2022-01-10 DIAGNOSIS — K921 Melena: Secondary | ICD-10-CM

## 2022-01-10 DIAGNOSIS — R739 Hyperglycemia, unspecified: Secondary | ICD-10-CM | POA: Diagnosis not present

## 2022-01-10 DIAGNOSIS — D72829 Elevated white blood cell count, unspecified: Secondary | ICD-10-CM | POA: Diagnosis not present

## 2022-01-10 DIAGNOSIS — K922 Gastrointestinal hemorrhage, unspecified: Secondary | ICD-10-CM

## 2022-01-10 HISTORY — PX: ESOPHAGOGASTRODUODENOSCOPY (EGD) WITH PROPOFOL: SHX5813

## 2022-01-10 LAB — COMPREHENSIVE METABOLIC PANEL
ALT: 25 U/L (ref 0–44)
ALT: 23 U/L (ref 0–44)
AST: 28 U/L (ref 15–41)
AST: 27 U/L (ref 15–41)
Albumin: 4.1 g/dL (ref 3.5–5.0)
Albumin: 3.7 g/dL (ref 3.5–5.0)
Alkaline Phosphatase: 59 U/L (ref 38–126)
Alkaline Phosphatase: 54 U/L (ref 38–126)
Anion gap: 8 (ref 5–15)
Anion gap: 7 (ref 5–15)
BUN: 19 mg/dL (ref 8–23)
BUN: 18 mg/dL (ref 8–23)
CO2: 24 mmol/L (ref 22–32)
CO2: 24 mmol/L (ref 22–32)
Calcium: 9.1 mg/dL (ref 8.9–10.3)
Calcium: 8.7 mg/dL — ABNORMAL LOW (ref 8.9–10.3)
Chloride: 107 mmol/L (ref 98–111)
Chloride: 106 mmol/L (ref 98–111)
Creatinine, Ser: 1.07 mg/dL (ref 0.61–1.24)
Creatinine, Ser: 1.09 mg/dL (ref 0.61–1.24)
GFR, Estimated: >60 mL/min (ref >60)
GFR, Estimated: >60 mL/min (ref >60)
Glucose, Bld: 154 mg/dL — ABNORMAL HIGH (ref 70–99)
Glucose, Bld: 125 mg/dL — ABNORMAL HIGH (ref 70–99)
Potassium: 4.2 mmol/L (ref 3.5–5.1)
Potassium: 4.1 mmol/L (ref 3.5–5.1)
Sodium: 139 mmol/L (ref 135–145)
Sodium: 137 mmol/L (ref 135–145)
Total Bilirubin: 1.9 mg/dL — ABNORMAL HIGH (ref 0.3–1.2)
Total Bilirubin: 2.2 mg/dL — ABNORMAL HIGH (ref 0.3–1.2)
Total Protein: 7 g/dL (ref 6.5–8.1)
Total Protein: 6.4 g/dL — ABNORMAL LOW (ref 6.5–8.1)

## 2022-01-10 LAB — PROTIME-INR
INR: 1.1 (ref 0.8–1.2)
INR: 1.1 (ref 0.8–1.2)
Prothrombin Time: 13.7 seconds (ref 11.4–15.2)
Prothrombin Time: 13.8 seconds (ref 11.4–15.2)

## 2022-01-10 LAB — CBC
HCT: 46.9 % (ref 39.0–52.0)
Hemoglobin: 15.9 g/dL (ref 13.0–17.0)
MCH: 29.9 pg (ref 26.0–34.0)
MCHC: 33.9 g/dL (ref 30.0–36.0)
MCV: 88.2 fL (ref 80.0–100.0)
Platelets: 204 10*3/uL (ref 150–400)
RBC: 5.32 MIL/uL (ref 4.22–5.81)
RDW: 12.5 % (ref 11.5–15.5)
WBC: 12 10*3/uL — ABNORMAL HIGH (ref 4.0–10.5)
nRBC: 0 % (ref 0.0–0.2)

## 2022-01-10 LAB — CBC WITH DIFFERENTIAL/PLATELET
Abs Immature Granulocytes: 0.03 10*3/uL (ref 0.00–0.07)
Basophils Absolute: 0 10*3/uL (ref 0.0–0.1)
Basophils Relative: 0 %
Eosinophils Absolute: 0.1 10*3/uL (ref 0.0–0.5)
Eosinophils Relative: 1 %
HCT: 44.8 % (ref 39.0–52.0)
Hemoglobin: 15.1 g/dL (ref 13.0–17.0)
Immature Granulocytes: 0 %
Lymphocytes Relative: 4 %
Lymphs Abs: 0.4 10*3/uL — ABNORMAL LOW (ref 0.7–4.0)
MCH: 29.8 pg (ref 26.0–34.0)
MCHC: 33.7 g/dL (ref 30.0–36.0)
MCV: 88.4 fL (ref 80.0–100.0)
Monocytes Absolute: 0.4 10*3/uL (ref 0.1–1.0)
Monocytes Relative: 4 %
Neutro Abs: 9.5 10*3/uL — ABNORMAL HIGH (ref 1.7–7.7)
Neutrophils Relative %: 91 %
Platelets: 189 10*3/uL (ref 150–400)
RBC: 5.07 MIL/uL (ref 4.22–5.81)
RDW: 12.6 % (ref 11.5–15.5)
WBC: 10.4 10*3/uL (ref 4.0–10.5)
nRBC: 0 % (ref 0.0–0.2)

## 2022-01-10 LAB — HEMOGLOBIN A1C
Hgb A1c MFr Bld: 5.4 % (ref 4.8–5.6)
Mean Plasma Glucose: 108.28 mg/dL

## 2022-01-10 LAB — ECHOCARDIOGRAM COMPLETE
AR max vel: 2.06 cm2
AV Area VTI: 2.22 cm2
AV Area mean vel: 1.96 cm2
AV Mean grad: 5 mmHg
AV Peak grad: 9.6 mmHg
Ao pk vel: 1.55 m/s
Area-P 1/2: 2.57 cm2
Height: 68 in
MV VTI: 2.02 cm2
S' Lateral: 3.1 cm
Weight: 3280.44 oz

## 2022-01-10 LAB — TYPE AND SCREEN
ABO/RH(D): A POS
Antibody Screen: NEGATIVE

## 2022-01-10 LAB — MAGNESIUM: Magnesium: 2.1 mg/dL (ref 1.7–2.4)

## 2022-01-10 LAB — PHOSPHORUS: Phosphorus: 2.4 mg/dL — ABNORMAL LOW (ref 2.5–4.6)

## 2022-01-10 LAB — APTT: aPTT: 31 seconds (ref 24–36)

## 2022-01-10 SURGERY — ESOPHAGOGASTRODUODENOSCOPY (EGD) WITH PROPOFOL
Anesthesia: General

## 2022-01-10 MED ORDER — HYDROMORPHONE HCL 1 MG/ML IJ SOLN
0.5000 mg | INTRAMUSCULAR | Status: DC | PRN
Start: 2022-01-10 — End: 2022-01-10

## 2022-01-10 MED ORDER — PROPOFOL 10 MG/ML IV BOLUS
INTRAVENOUS | Status: DC | PRN
Start: 2022-01-10 — End: 2022-01-10
  Administered 2022-01-10: 80 mg via INTRAVENOUS

## 2022-01-10 MED ORDER — ACETAMINOPHEN 500 MG PO TABS
1000.0000 mg | ORAL_TABLET | Freq: Once | ORAL | Status: AC
  Administered 2022-01-10: 1000 mg via ORAL
  Filled 2022-01-10: qty 2

## 2022-01-10 MED ORDER — LACTATED RINGERS IV SOLN: INTRAVENOUS | Status: DC

## 2022-01-10 MED ORDER — ONDANSETRON HCL 4 MG/2ML IJ SOLN: 4.0000 mg | Freq: Four times a day (QID) | INTRAMUSCULAR | Status: DC | PRN

## 2022-01-10 MED ORDER — ACETAMINOPHEN 325 MG PO TABS: 650.0000 mg | ORAL_TABLET | Freq: Four times a day (QID) | ORAL | Status: DC | PRN

## 2022-01-10 MED ORDER — OXYCODONE HCL 5 MG PO TABS: 5.0000 mg | ORAL_TABLET | Freq: Four times a day (QID) | ORAL | Status: DC | PRN

## 2022-01-10 MED ORDER — NA SULFATE-K SULFATE-MG SULF 17.5-3.13-1.6 GM/177ML PO SOLN: 354.0000 mL | Freq: Once | ORAL | 0 refills | Status: DC

## 2022-01-10 MED ORDER — PANTOPRAZOLE 80MG IVPB - SIMPLE MED
80.0000 mg | Freq: Once | INTRAVENOUS | Status: AC
  Administered 2022-01-10: 80 mg via INTRAVENOUS
  Filled 2022-01-10: qty 100

## 2022-01-10 MED ORDER — PANTOPRAZOLE SODIUM 40 MG IV SOLR: 40.0000 mg | Freq: Two times a day (BID) | INTRAVENOUS | Status: DC

## 2022-01-10 MED ORDER — MELATONIN 5 MG PO TABS: 2.5000 mg | ORAL_TABLET | Freq: Every evening | ORAL | Status: DC | PRN

## 2022-01-10 MED ORDER — PROPOFOL 500 MG/50ML IV EMUL
INTRAVENOUS | Status: DC | PRN
  Administered 2022-01-10: 150 ug/kg/min via INTRAVENOUS

## 2022-01-10 MED ORDER — ONDANSETRON HCL 4 MG/2ML IJ SOLN
4.0000 mg | Freq: Once | INTRAMUSCULAR | Status: AC
Start: 2022-01-10 — End: 2022-01-10
  Administered 2022-01-10: 4 mg via INTRAVENOUS
  Filled 2022-01-10: qty 2

## 2022-01-10 MED ORDER — SODIUM CHLORIDE 0.9 % IV SOLN
INTRAVENOUS | Status: DC
  Administered 2022-01-10: 1000 mL via INTRAVENOUS

## 2022-01-10 MED ORDER — LIDOCAINE HCL (CARDIAC) PF 100 MG/5ML IV SOSY
PREFILLED_SYRINGE | INTRAVENOUS | Status: DC | PRN
  Administered 2022-01-10: 50 mg via INTRAVENOUS

## 2022-01-10 MED ORDER — PANTOPRAZOLE INFUSION (NEW) - SIMPLE MED
8.0000 mg/h | INTRAVENOUS | Status: DC
  Administered 2022-01-10: 8 mg/h via INTRAVENOUS
  Filled 2022-01-10: qty 100

## 2022-01-10 MED ORDER — POLYETHYLENE GLYCOL 3350 17 G PO PACK: 17.0000 g | PACK | Freq: Every day | ORAL | Status: DC | PRN

## 2022-01-10 NOTE — ED Provider Notes (Signed)
? ?[facility] ?Provider Note ? ? ? Event Date/Time  ? First MD Initiated Contact with Patient 01/10/22 [phone]   ?  (approximate) ? ? ?History  ? ?Loss of Consciousness ? ? ?HPI ? ?Joel Galvan is a 76 y.o. male who presents to the ED for evaluation of Loss of Consciousness ?  ?I reviewed PCP visit from October.  History of HLD, GERD, intracranial AVM requiring craniotomy remotely, C1 fracture 6months ago follows with neurosurgery.  Last seen 2 months ago.  I reviewed outpatient MRI and CT scans of his cervical spine 2 months ago.  Multilevel degenerative changes, healing left-sided C1 lateral mass and neuroforaminal stenosis.   ? ?Patient presents to the ED, accompanied by his wife, for evaluation of a syncopal episode after few episodes of melena this evening.  All of the symptoms started in the past few hours, around 9 PM.  Reports developing an upset stomach and having a black and tarry bowel movement.  Subsequently feeling cold with chills and unable to get warm.  Return to the bathroom and had profuse melanotic diarrheal stool, again went back to bed and unable to get warm.  Return to the restroom to void, no hematuria, and felt presyncopal.  After standing from the toilet after voiding he had a syncopal episode after feeling presyncopal dizziness.  Struck the right side of his head on something.  Has been able to ambulate since then.  Reports soreness to his right head, neck.  Reports continued upset stomach without significant pain, just feels queasy and unwell. ? ?Reports using naproxen as needed for his neck.  Maybe once or twice per week.  Denies any other thinners or antiplatelets.  No aspirin. ? ?Physical Exam  ? ?Triage Vital Signs: ?ED Triage Vitals  ?Enc Vitals Group  ?   BP   ?   Pulse   ?   Resp   ?   Temp   ?   Temp src   ?   SpO2   ?   Weight   ?   Height   ?   Head Circumference   ?   Peak Flow   ?   Pain Score   ?   Pain Loc   ?   Pain Edu?   ?   Excl. in GC?   ? ? ?Most  recent vital signs: ?Vitals:  ? 01/10/22 0218  ?BP: 124/88  ?Pulse: 75  ?Resp: 20  ?Temp: 98.5 ?F (36.9 ?C)  ?SpO2: 96%  ? ? ?General: Awake, no distress.  Pleasant and conversational in full sentences.  Couple superficial abrasions to right-sided forehead and right brow.  No laceration or skin disruption.  No bleeding.  EOM intact without signs of entrapment or proptosis. ?CV:  Good peripheral perfusion.  Nontachycardic.  Seems well perfused. ?Resp:  Normal effort.  ?Abd:  No distention.  Minimal tenderness diffusely, mostly to the upper and left-sided abdomen.  No peritoneal features or guarding ?MSK:  No deformity noted.  No midline spinal tenderness throughout. ?Neuro:  No focal deficits appreciated. Cranial nerves II through XII intact ?5/5 strength and sensation in all 4 extremities ?Other:   ? ? ?ED Results / Procedures / Treatments  ? ?Labs ?(all labs ordered are listed, but only abnormal results are displayed) ?Labs Reviewed  ?CBC - Abnormal; Notable for the following components:  ?    Result Value  ? WBC 12.0 (*)   ? All other components within normal limits  ?  COMPREHENSIVE METABOLIC PANEL - Abnormal; Notable for the following components:  ? Glucose, Bld 154 (*)   ? Total Bilirubin 1.9 (*)   ? All other components within normal limits  ?PROTIME-INR  ?APTT  ?URINALYSIS, ROUTINE W REFLEX MICROSCOPIC  ?TYPE AND SCREEN  ? ? ?EKG ?Sinus rhythm, rate of 74 bpm.  Normal axis and intervals.  Early R wave progression.  No ischemic features. ? ?RADIOLOGY ?CT head and neck reviewed by me without evidence of acute intracranial pathology or cervical fracture, respectively. ? ?Official radiology report(s): ?CT HEAD WO CONTRAST ( ) ? ?Result Date: 01/10/2022 ?CLINICAL DATA:  Fall EXAM: CT HEAD WITHOUT CONTRAST CT CERVICAL SPINE WITHOUT CONTRAST TECHNIQUE: Multidetector CT imaging of the head and cervical spine was performed following the standard protocol without intravenous contrast. Multiplanar CT image  reconstructions of the cervical spine were also generated. RADIATION DOSE REDUCTION: This exam was performed according to the departmental dose-optimization program which includes automated exposure control, adjustment of the mA and/or kV according to patient size and/or use of iterative reconstruction technique. COMPARISON:  None. FINDINGS: CT HEAD FINDINGS Brain: There is no mass, hemorrhage or extra-axial collection. The size and configuration of the ventricles and extra-axial CSF spaces are normal. The brain parenchyma is normal, without evidence of acute or chronic infarction. Vascular: No abnormal hyperdensity of the major intracranial arteries or dural venous sinuses. No intracranial atherosclerosis. Skull: The visualized skull base, calvarium and extracranial soft tissues are normal. Sinuses/Orbits: No fluid levels or advanced mucosal thickening of the visualized paranasal sinuses. No mastoid or middle ear effusion. The orbits are normal. CT CERVICAL SPINE FINDINGS Alignment: No static subluxation. Facets are aligned. Occipital condyles are normally positioned. Skull base and vertebrae: No acute fracture. Soft tissues and spinal canal: No prevertebral fluid or swelling. No visible canal hematoma. Disc levels: Left foraminal stenosis C3-4, C4-5 C5-6 with potential impingement. Upper chest: No pneumothorax, pulmonary nodule or pleural effusion. Other: Normal visualized paraspinal cervical soft tissues. IMPRESSION: 1. No acute intracranial abnormality. 2. No acute fracture or static subluxation of the cervical spine. 3. Left foraminal stenosis at C3-4, C4-5 C5-6 with potential impingement. Electronically Signed   By: Deatra Robinson M.D.   On: 01/10/2022 03:00  ? ?CT Cervical Spine Wo Contrast ? ?Result Date: 01/10/2022 ?CLINICAL DATA:  Fall EXAM: CT HEAD WITHOUT CONTRAST CT CERVICAL SPINE WITHOUT CONTRAST TECHNIQUE: Multidetector CT imaging of the head and cervical spine was performed following the standard  protocol without intravenous contrast. Multiplanar CT image reconstructions of the cervical spine were also generated. RADIATION DOSE REDUCTION: This exam was performed according to the departmental dose-optimization program which includes automated exposure control, adjustment of the mA and/or kV according to patient size and/or use of iterative reconstruction technique. COMPARISON:  None. FINDINGS: CT HEAD FINDINGS Brain: There is no mass, hemorrhage or extra-axial collection. The size and configuration of the ventricles and extra-axial CSF spaces are normal. The brain parenchyma is normal, without evidence of acute or chronic infarction. Vascular: No abnormal hyperdensity of the major intracranial arteries or dural venous sinuses. No intracranial atherosclerosis. Skull: The visualized skull base, calvarium and extracranial soft tissues are normal. Sinuses/Orbits: No fluid levels or advanced mucosal thickening of the visualized paranasal sinuses. No mastoid or middle ear effusion. The orbits are normal. CT CERVICAL SPINE FINDINGS Alignment: No static subluxation. Facets are aligned. Occipital condyles are normally positioned. Skull base and vertebrae: No acute fracture. Soft tissues and spinal canal: No prevertebral fluid or swelling. No visible canal hematoma. Disc levels:  Left foraminal stenosis C3-4, C4-5 C5-6 with potential impingement. Upper chest: No pneumothorax, pulmonary nodule or pleural effusion. Other: Normal visualized paraspinal cervical soft tissues. IMPRESSION: 1. No acute intracranial abnormality. 2. No acute fracture or static subluxation of the cervical spine. 3. Left foraminal stenosis at C3-4, C4-5 C5-6 with potential impingement. Electronically Signed   By: Deatra Robinson M.D.   On: 01/10/2022 03:00   ? ?PROCEDURES and INTERVENTIONS: ? ?.1-3 Lead EKG Interpretation ?Performed by: Delton Prairie, MD ?Authorized by: Delton Prairie, MD  ? ?  Interpretation: normal   ?  ECG rate:  80 ?  ECG rate  assessment: normal   ?  Rhythm: sinus rhythm   ?  Ectopy: none   ?  Conduction: normal   ? ?Medications  ?pantoprazole (PROTONIX) 80 mg /NS 100 mL IVPB (80 mg Intravenous New Bag/Given 01/10/22 0304)  ?pantoprozole (PROTONIX
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
## 2022-01-10 NOTE — Consult Note (Signed)
? ? ? ?Cephas Darby, MD ?[address]  ?[address]  ?[place], [place] [address]  ?Main: [phone]  ?Fax: [phone] ?Pager: [phone] ? ? Consultation ? ?Referring Provider:     No ref. provider found ?Primary Care Physician:  Rusty Aus, MD ?Primary Gastroenterologist: Althia Forts     ?Reason for Consultation:     Melena ? ?Date of Admission:  01/10/2022 ?Date of Consultation:  01/10/2022 ?       ? HPI:   ?Joel Galvan is a 76 y.o. male with history of cervical spine fracture, closely followed by neurosurgery presented with 3 large episodes of melena followed by syncope.  Patient fell and hit his face, underwent CT head and cervical spine without contrast which did not reveal any intracranial bleed other than known history of cervical fracture.  Patient has been taking NSAIDs intermittently for arthritis pain.  Last NSAID use was about 3 weeks ago.  Denies any chest pain, shortness of breath.  Patient has been hemodynamically stable in the ER.  Hemoglobin normal 15.1, normal BUN/creatinine 18/1.09, isolated T. bili 2.2, patient has history of Joubert syndrome.  Patient is kept n.p.o. and GI is consulted for further evaluation ? ? ?NSAIDs: Intermittent NSAID use for history of arthritis ? ?Antiplts/Anticoagulants/Anti thrombotics: None ? ?GI Procedures: None ? ?Past Medical History:  ?Diagnosis Date  ? Bursitis   ? Chest pain, atypical   ? a. 03/2006 Cath: EF 55%, mild luminal irregularities; b. 01/2009 Lexi MV: EF 64%, fixed inferior defect w normal wall motion, likely diaphragmatic attenuation. No ischemia;  c. 2014 Ex MV: no ischemia-->c/p improved w/ PPI;  d. 01/2015 ETT: Ex time 9 mins, Max HR 141, no st/t changes; e. 01/2017 Ex MV: No isch, EF 46% (artifact - no by echo); f. 04/2020 Cor CTA: LAD 0-25p. Otw nl cors. Cor Ca2+ = 85.7.  ? CNS (central nervous system disease)   ? AVM s/p surgery in 1996  ? GERD (gastroesophageal reflux disease)   ? History of echocardiogram   ? a. 10/2012 Echo: EF  50-55%, apical HK, opacity noted in apical region concerning for chronic thrombus (reviewed by Dr. 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 03/2017 Echo: EF 55-60%, no rwma, Asc Ao 3.45m, nl RV fxn, mildly dil RA.  ? Hyperlipidemia   ? Hyperuricemia   ? Osteoarthritis   ? hands and feet  ? Plantar fasciitis   ? Sinusitis   ? Syncope and collapse   ? history  ? Tendonitis   ? ? ?Past Surgical History:  ?Procedure Laterality Date  ? APPENDECTOMY  1958  ? BALLOON SINUPLASTY  11/29/2017  ? [facility]  ? CARDIAC CATHETERIZATION  2007  ? [facility]  ? CRANIOTOMY  1996  ? for AVM  ? FRACTURE SURGERY Left   ? arm  ? HEMORRHOID SURGERY    ? KNEE ARTHROSCOPY WITH MENISCAL REPAIR Left 10/04/2018  ? Procedure: KNEE ARTHROSCOPY WITH MENISCAL REPAIR;  Surgeon: PLeim Fabry MD;  [facility] [facility]  Service: Orthopedics;  Laterality: Left;  ? Right hip replacement  1999  ? 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 ROTATOR CUFF REPAIR Right 10/08/2014  ? TONSILLECTOMY  1966  ? TOTAL HIP REVISION Right 08/27/2019  ? Procedure: TOTAL HIP REVISION POLY EXCHANGE;  Surgeon: MHessie Knows MD;  Location: [facility];  Service: Orthopedics;  Laterality: Right;  ? ? ?Current Facility-Administered Medications:  ?  0.9 %  sodium chloride infusion, ,  Intravenous, Continuous, Andersson Larrabee, Tally Due, MD, Last Rate: 20 mL/hr at 01/10/22 1156, 1,000 mL at 01/10/22 1156 ?  [MAR Hold] acetaminophen (TYLENOL) tablet 650 mg, 650 mg, Oral, Q6H PRN, Kayleen Memos, DO ?  [MAR Hold] melatonin tablet 2.5 mg, 2.5 mg, Oral, QHS PRN, Kayleen Memos, DO ?  [MAR Hold] ondansetron (ZOFRAN) injection 4 mg, 4 mg, Intravenous, Q6H PRN, Kayleen Memos, DO ?  [MAR Hold] oxyCODONE (Oxy IR/ROXICODONE) immediate release tablet 5 mg, 5 mg, Oral, Q6H PRN, Kayleen Memos, DO ?  [MAR Hold] pantoprazole (PROTONIX) injection 40 mg, 40 mg, Intravenous, Q12H, Vladimir Crofts, MD ?  pantoprozole (PROTONIX) 80 mg /NS 100 mL infusion, 8 mg/hr, Intravenous,  Continuous, Vladimir Crofts, MD, Last Rate: 10 mL/hr at 01/10/22 0304, 8 mg/hr at 01/10/22 0304 ?  [MAR Hold] polyethylene glycol (MIRALAX / GLYCOLAX) packet 17 g, 17 g, Oral, Daily PRN, Irene Pap N, DO ? ? ?Family History  ?Problem Relation Age of Onset  ? Heart attack Father 69  ?  ? ?Social History  ? ?Tobacco Use  ? Smoking status: Never  ? Smokeless tobacco: Never  ?Vaping Use  ? Vaping Use: Never used  ?Substance Use Topics  ? Alcohol use: Yes  ?  Comment: avg: 1 drink/week  ? Drug use: No  ? ? ?Allergies as of 01/10/2022 - Review Complete 01/10/2022  ?Allergen Reaction Noted  ? Ibuprofen Nausea And Vomiting 06/23/2021  ? Methocarbamol Hives 06/13/2008  ? Prednisone Other (See Comments) 06/13/2008  ? ? ?Review of Systems:    ?All systems reviewed and negative except where noted in HPI. ? ? Physical Exam:  ?Vital signs in last 24 hours: ?Temp:  [98.5 ?F (36.9 ?C)-98.6 ?F (37 ?C)] 98.6 ?F (37 ?C) (04/10 1024) ?Pulse Rate:  [72-75] 73 (04/10 1024) ?Resp:  [11-21] 17 (04/10 1024) ?BP: (105-134)/(72-88) 105/77 (04/10 1024) ?SpO2:  [95 %-99 %] 97 % (04/10 1024) ?Weight:  [93 kg] 93 kg (04/10 0221) ?Last BM Date : 01/10/22 ?General:   Pleasant, cooperative in NAD ?Head:  Normocephalic and atraumatic. ?Eyes:   No icterus.   Conjunctiva pink. PERRLA. ?Ears:  Normal auditory acuity. ?Neck:  Supple; no masses or thyroidomegaly ?Lungs: Respirations even and unlabored. Lungs clear to auscultation bilaterally.   No wheezes, crackles, or rhonchi.  ?Heart:  Regular rate and rhythm;  Without murmur, clicks, rubs or gallops ?Abdomen:  Soft, nondistended, nontender. Normal bowel sounds. No appreciable masses or hepatomegaly.  No rebound or guarding.  ?Rectal:  Not performed. ?Msk:  Symmetrical without gross deformities.  Strength normal ?Extremities:  Without edema, cyanosis or clubbing. ?Neurologic:  Alert and oriented x3;  grossly normal neurologically. ?Skin:  Intact without significant lesions or rashes. ?Psych:  Alert and  cooperative. Normal affect. ? ?LAB RESULTS: ? ?  Latest Ref Rng & Units 01/10/2022  ?  7:33 AM 01/10/2022  ?  2:22 AM 08/27/2019  ?  6:23 PM  ?CBC  ?WBC 4.0 - 10.5 K/uL 10.4   12.0   10.7    ?Hemoglobin 13.0 - 17.0 g/dL 15.1   15.9   14.1    ?Hematocrit 39.0 - 52.0 % 44.8   46.9   41.9    ?Platelets 150 - 400 K/uL 189   204   192    ? ? ?BMET ? ?  Latest Ref Rng & Units 01/10/2022  ?  7:33 AM 01/10/2022  ?  2:25 AM 04/02/2020  ? 10:13 AM  ?BMP  ?Glucose 70 -  99 mg/dL 125   154     ?BUN 8 - 23 mg/dL 18   19     ?Creatinine 0.61 - 1.24 mg/dL 1.09   1.07   1.20    ?Sodium 135 - 145 mmol/L 137   139     ?Potassium 3.5 - 5.1 mmol/L 4.1   4.2     ?Chloride 98 - 111 mmol/L 106   107     ?CO2 22 - 32 mmol/L 24   24     ?Calcium 8.9 - 10.3 mg/dL 8.7   9.1     ? ? ?LFT ? ?  Latest Ref Rng & Units 01/10/2022  ?  7:33 AM 01/10/2022  ?  2:25 AM 12/11/2014  ?  8:25 AM  ?Hepatic Function  ?Total Protein 6.5 - 8.1 g/dL 6.4   7.0   6.2    ?Albumin 3.5 - 5.0 g/dL 3.7   4.1   4.5    ?AST 15 - 41 U/L '27   28   25    ' ?ALT 0 - 44 U/L 23   25   35    ?Alk Phosphatase 38 - 126 U/L 54   59   61    ?Total Bilirubin 0.3 - 1.2 mg/dL 2.2   1.9   1.1    ?Bilirubin, Direct 0.00 - 0.40 mg/dL   0.22    ? ? ? ?STUDIES: ?CT HEAD WO CONTRAST (5MM) ? ?Result Date: 01/10/2022 ?CLINICAL DATA:  Fall EXAM: CT HEAD WITHOUT CONTRAST CT CERVICAL SPINE WITHOUT CONTRAST TECHNIQUE: Multidetector CT imaging of the head and cervical spine was performed following the standard protocol without intravenous contrast. Multiplanar CT image reconstructions of the cervical spine were also generated. RADIATION DOSE REDUCTION: This exam was performed according to the departmental dose-optimization program which includes automated exposure control, adjustment of the mA and/or kV according to patient size and/or use of iterative reconstruction technique. COMPARISON:  None. FINDINGS: CT HEAD FINDINGS Brain: There is no mass, hemorrhage or extra-axial collection. The size and  configuration of the ventricles and extra-axial CSF spaces are normal. The brain parenchyma is normal, without evidence of acute or chronic infarction. Vascular: No abnormal hyperdensity of the major intracranial arte

## 2022-01-10 NOTE — Progress Notes (Signed)
PT Cancellation Note ? ?Patient Details ?Name: [name] ?MRN: [id number] ?DOB: 1946-01-08 ? ? ?Cancelled Treatment:    Reason Eval/Treat Not Completed: Patient at procedure or test/unavailable (Consult received and chart reviewed.  Patient currently off unit for procedure; will re-attempt at later time/date as medically appropriate and available.) ? ? ?Emer Onnen H. Manson Passey, PT, DPT, NCS ?01/10/22, 11:56 AM ?[phone] ? ? ?

## 2022-01-10 NOTE — Transfer of Care (Signed)
Immediate Anesthesia Transfer of Care Note ? ?Patient: Joel Galvan ? ?Procedure(s) Performed: ESOPHAGOGASTRODUODENOSCOPY (EGD) WITH PROPOFOL ? ?Patient Location: PACU ? ?Anesthesia Type:General ? ?Level of Consciousness: sedated ? ?Airway & Oxygen Therapy: Patient Spontanous Breathing ? ?Post-op Assessment: Report given to RN and Post -op Vital signs reviewed and stable ? ?Post vital signs: Reviewed and stable ? ?Last Vitals:  ?Vitals Value Taken Time  ?BP 101/66 01/10/22 1244  ?Temp    ?Pulse 66 01/10/22 1244  ?Resp 18 01/10/22 1244  ?SpO2 94 % 01/10/22 1244  ? ? ?Last Pain:  ?Vitals:  ? 01/10/22 1243  ?TempSrc:   ?PainSc: 0-No pain  ?   ? ?  ? ?Complications: No notable events documented. ?

## 2022-01-10 NOTE — Op Note (Signed)
[facility] ?Gastroenterology ?Patient Name: Joel Galvan ?Procedure Date: 01/10/2022 12:25 PM ?MRN: [id number] ?Account #: [id number] ?Date of Birth: April 28, 1946 ?Admit Type: Inpatient ?Age: 76 ?Room: [facility] ENDO ROOM 1 ?Gender: Male ?Note Status: Finalized ?Instrument Name: Upper Endoscope [id number] ?Procedure:             Upper GI endoscopy ?Indications:           Melena ?Providers:             Lin Landsman MD, MD ?Referring MD:          Rusty Aus, MD (Referring MD) ?Medicines:             General Anesthesia ?Complications:         No immediate complications. Estimated blood loss: None. ?Procedure:             Pre-Anesthesia Assessment: ?                       - Prior to the procedure, a History and Physical was  ?                       performed, and patient medications and allergies were  ?                       reviewed. The patient is competent. The risks and  ?                       benefits of the procedure and the sedation options and  ?                       risks were discussed with the patient. All questions  ?                       were answered and informed consent was obtained.  ?                       Patient identification and proposed procedure were  ?                       verified by the physician, the nurse, the  ?                       anesthesiologist, the anesthetist and the technician  ?                       in the pre-procedure area in the procedure room in the  ?                       endoscopy suite. Mental Status Examination: alert and  ?                       oriented. Airway Examination: normal oropharyngeal  ?                       airway and neck mobility. Respiratory Examination:  ?                       clear to auscultation. CV Examination: normal.  ?  Prophylactic Antibiotics: The patient does not require  ?                       prophylactic antibiotics. Prior Anticoagulants: The  ?                       patient has taken no  previous anticoagulant or  ?                       antiplatelet agents. ASA Grade Assessment: III - A  ?                       patient with severe systemic disease. After reviewing  ?                       the risks and benefits, the patient was deemed in  ?                       satisfactory condition to undergo the procedure. The  ?                       anesthesia plan was to use general anesthesia.  ?                       Immediately prior to administration of medications,  ?                       the patient was re-assessed for adequacy to receive  ?                       sedatives. The heart rate, respiratory rate, oxygen  ?                       saturations, blood pressure, adequacy of pulmonary  ?                       ventilation, and response to care were monitored  ?                       throughout the procedure. The physical status of the  ?                       patient was re-assessed after the procedure. ?                       After obtaining informed consent, the endoscope was  ?                       passed under direct vision. Throughout the procedure,  ?                       the patient's blood pressure, pulse, and oxygen  ?                       saturations were monitored continuously. The Endoscope  ?                       was introduced through the mouth, and advanced to the  ?  second part of duodenum. The upper GI endoscopy was  ?                       accomplished without difficulty. The patient tolerated  ?                       the procedure well. ?Findings: ?     The esophagus was normal. ?     The stomach was normal. ?     The examined duodenum was normal. ?Impression:            - Normal esophagus. ?                       - Normal stomach. ?                       - Normal examined duodenum. ?                       - No specimens collected. ?Recommendation:        - Return patient to hospital ward for possible  ?                       discharge same day. ?                        - Advance diet as tolerated today. ?                       - Perform a colonoscopy at appointment to be scheduled  ?                       as outpt. ?Procedure Code(s):     --- Professional --- ?                       [phone], Esophagogastroduodenoscopy, flexible,  ?                       transoral; diagnostic, including collection of  ?                       specimen(s) by brushing or washing, when performed  ?                       (separate procedure) ?Diagnosis Code(s):     --- Professional --- ?                       K92.1, Melena (includes Hematochezia) ?CPT copyright 2019 [employer] Association. All rights reserved. ?The codes documented in this report are preliminary and upon coder review may  ?be revised to meet current compliance requirements. ?Dr. Ulyess Mort ?Mauriah Mcmillen Raeanne Gathers MD, MD ?01/10/2022 12:39:22 PM ?This report has been signed electronically. ?Number of Addenda: 0 ?Note Initiated On: 01/10/2022 12:25 PM ?Estimated Blood Loss:  Estimated blood loss: none. ?     [facility] ?

## 2022-01-10 NOTE — Plan of Care (Signed)
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
## 2022-01-10 NOTE — Anesthesia Postprocedure Evaluation (Signed)
Anesthesia Post Note ? ?Patient: Joel Galvan ? ?Procedure(s) Performed: ESOPHAGOGASTRODUODENOSCOPY (EGD) WITH PROPOFOL ? ?Patient location during evaluation: PACU ?Anesthesia Type: General ?Level of consciousness: awake and alert, oriented and patient cooperative ?Pain management: pain level controlled ?Vital Signs Assessment: post-procedure vital signs reviewed and stable ?Respiratory status: spontaneous breathing, nonlabored ventilation and respiratory function stable ?Cardiovascular status: blood pressure returned to baseline and stable ?Postop Assessment: adequate PO intake ?Anesthetic complications: no ? ? ?No notable events documented. ? ? ?Last Vitals:  ?Vitals:  ? 01/10/22 1250 01/10/22 1300  ?BP: 105/70 121/78  ?Pulse: 61 68  ?Resp: 18 20  ?Temp:    ?SpO2: 95% 96%  ?  ?Last Pain:  ?Vitals:  ? 01/10/22 1250  ?TempSrc:   ?PainSc: 0-No pain  ? ? ?  ?  ?  ?  ?  ?  ? ?Reed Breech ? ? ? ? ?

## 2022-01-10 NOTE — Discharge Instructions (Signed)
HOLD off on taking any OTC NSAIDS till GI evaluation as out pt is completed ?
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
## 2022-01-10 NOTE — Progress Notes (Signed)
Reviewed discharge instructions with pt. Wife at bedside. Pt verbalized understanding. Iv catheters intact upon removal. PT discharged with all personal belongings. Staff wheeled pt out. Pt transported to home by family private vehicle. ?
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
## 2022-01-10 NOTE — Anesthesia Procedure Notes (Signed)
Date/Time: 01/10/2022 12:27 PM ?Performed by: Ginger Carne, CRNA ?Pre-anesthesia Checklist: Patient identified, Emergency Drugs available, Suction available, Patient being monitored and Timeout performed ?Patient Re-evaluated:Patient Re-evaluated prior to induction ?Oxygen Delivery Method: Nasal cannula ?Preoxygenation: Pre-oxygenation with 100% oxygen ?Induction Type: IV induction ? ? ? ? ?

## 2022-01-11 ENCOUNTER — Encounter: Payer: Self-pay | Admitting: Gastroenterology

## 2022-01-31 ENCOUNTER — Telehealth: Payer: Self-pay | Admitting: Gastroenterology

## 2022-01-31 NOTE — Telephone Encounter (Signed)
Patient called stating that a good friend of his passed away and the funeral is tomorrow at 2pm. Patient has a colonoscopy tomorrow. States he would like to discuss some options of possibly rescheduling. Does not want to cancel at this time. Wants to hear his options before canceling. Requesting call back asap.  ?

## 2022-01-31 NOTE — Telephone Encounter (Signed)
Patient states that he would really like to attend his friends funeral but states he is leaving to go out of town several times in the next couple of months. Gave him our next available which is 02/15/22 and he states he has a concert that day. He states that he is going to talk to his wife and give [place] a call back in what he decides  ?

## 2022-01-31 NOTE — Telephone Encounter (Signed)
Patient states he will leave procedure on for tomorrow  ?

## 2022-02-01 ENCOUNTER — Ambulatory Visit
Admission: RE | Admit: 2022-02-01 | Discharge: 2022-02-01 | Disposition: A | Discharge status: Home / Self Care | Payer: Medicare PPO | Attending: Gastroenterology | Admitting: Gastroenterology

## 2022-02-01 ENCOUNTER — Ambulatory Visit: Payer: Medicare PPO | Admitting: Anesthesiology

## 2022-02-01 ENCOUNTER — Encounter
Admission: RE | Disposition: A | Discharge status: Home / Self Care | Payer: Self-pay | Source: Home / Self Care | Attending: Gastroenterology

## 2022-02-01 DIAGNOSIS — K219 Gastro-esophageal reflux disease without esophagitis: Secondary | ICD-10-CM | POA: Insufficient documentation

## 2022-02-01 DIAGNOSIS — K921 Melena: Secondary | ICD-10-CM | POA: Diagnosis not present

## 2022-02-01 HISTORY — PX: COLONOSCOPY WITH PROPOFOL: SHX5780

## 2022-02-01 SURGERY — COLONOSCOPY WITH PROPOFOL
Anesthesia: General

## 2022-02-01 MED ORDER — LIDOCAINE HCL (PF) 2 % IJ SOLN
INTRAMUSCULAR | Status: AC
  Filled 2022-02-01: qty 5

## 2022-02-01 MED ORDER — PROPOFOL 500 MG/50ML IV EMUL
INTRAVENOUS | Status: DC | PRN
Start: 2022-02-01 — End: 2022-02-01
  Administered 2022-02-01: 150 ug/kg/min via INTRAVENOUS

## 2022-02-01 MED ORDER — SODIUM CHLORIDE 0.9 % IV SOLN: INTRAVENOUS | Status: DC

## 2022-02-01 MED ORDER — PROPOFOL 10 MG/ML IV BOLUS
INTRAVENOUS | Status: DC | PRN
  Administered 2022-02-01: 80 mg via INTRAVENOUS

## 2022-02-01 MED ORDER — LIDOCAINE HCL (CARDIAC) PF 100 MG/5ML IV SOSY
PREFILLED_SYRINGE | INTRAVENOUS | Status: DC | PRN
  Administered 2022-02-01: 40 mg via INTRAVENOUS

## 2022-02-01 MED ORDER — PROPOFOL 500 MG/50ML IV EMUL
INTRAVENOUS | Status: AC
  Filled 2022-02-01: qty 50

## 2022-02-01 NOTE — Anesthesia Preprocedure Evaluation (Signed)
Anesthesia Evaluation  ?Patient identified by MRN, date of birth, ID band ?Patient awake ? ? ? ?Reviewed: ?Allergy & Precautions, NPO status , Patient's Chart, lab work & pertinent test results ? ?Airway ?Mallampati: III ? ?TM Distance: <3 FB ?Neck ROM: full ? ? ? Dental ? ?(+) Chipped, Poor Dentition ?  ?Pulmonary ?neg pulmonary ROS, neg shortness of breath,  ?  ?Pulmonary exam normal ? ? ? ? ? ? ? Cardiovascular ?(-) anginaNormal cardiovascular exam ? ? ?  ?Neuro/Psych ?negative neurological ROS ? negative psych ROS  ? GI/Hepatic ?Neg liver ROS, GERD  Controlled,  ?Endo/Other  ?negative endocrine ROS ? Renal/GU ?negative Renal ROS  ?negative genitourinary ?  ?Musculoskeletal ? ? Abdominal ?  ?Peds ? Hematology ?negative hematology ROS ?(+)   ?Anesthesia Other Findings ?Past Medical History: ?No date: Bursitis ?No date: Chest pain, atypical ?    Comment:  a. 03/2006 Cath: EF 55%, mild luminal irregularities; b.  ?             01/2009 Lexi MV: EF 64%, fixed inferior defect w normal  ?             wall motion, likely diaphragmatic attenuation. No  ?             ischemia;  c. 2014 Ex MV: no ischemia-->c/p improved w/  ?             PPI;  d. 01/2015 ETT: Ex time 9 mins, Max HR 141, no st/t  ?             changes; e. 01/2017 Ex MV: No isch, EF 46% (artifact - no  ?             by echo); f. 04/2020 Cor CTA: LAD 0-25p. Otw nl cors. Cor  ?             Ca2+ = 85.7. ?No date: CNS (central nervous system disease) ?    Comment:  AVM s/p surgery in 1996 ?No date: GERD (gastroesophageal reflux disease) ?No date: History of echocardiogram ?    Comment:  a. 10/2012 Echo: EF 50-55%, apical HK, opacity noted in  ?             apical region concerning for chronic thrombus (reviewed  ?             by Dr. Kirke Corin - felt to be Ca2+ trabeculation), mildly dil ?             LA; b. 03/2017 Echo: EF 55-60%, no rwma, Asc Ao 3.85mm, nl  ?             RV fxn, mildly dil RA. ?No date: Hyperlipidemia ?No date:  Hyperuricemia ?No date: Osteoarthritis ?    Comment:  hands and feet ?No date: Plantar fasciitis ?No date: Sinusitis ?No date: Syncope and collapse ?    Comment:  history ?No date: Tendonitis ? ?Past Surgical History: ?1958: APPENDECTOMY ?11/29/2017: BALLOON SINUPLASTY ?    Comment:  Dr Wyn Forster office ?2007: CARDIAC CATHETERIZATION ?    Comment:  cone ?1996: CRANIOTOMY ?    Comment:  for AVM ?01/10/2022: ESOPHAGOGASTRODUODENOSCOPY (EGD) WITH PROPOFOL; N/A ?    Comment:  Procedure: ESOPHAGOGASTRODUODENOSCOPY (EGD) WITH  ?             PROPOFOL;  Surgeon: Toney Reil, MD;  Location:  ?             [facility];  Service: Gastroenterology;  Laterality:  ?             N/A; ?No date: FRACTURE SURGERY; Left ?    Comment:  arm ?No date: HEMORRHOID SURGERY ?10/04/2018: KNEE ARTHROSCOPY WITH MENISCAL REPAIR; Left ?    Comment:  Procedure: KNEE ARTHROSCOPY WITH MENISCAL REPAIR;   ?             Surgeon: Signa Kell, MD;  Location: [facility]  ?             [facility];  Service: Orthopedics;  Laterality: Left; ?1999: Right hip replacement ?    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    Comment:  Procedure: TOTAL HIP REVISION POLY EXCHANGE;  Surgeon:  ?             Kennedy Bucker, MD;  Location: [facility];  Service:  ?             Orthopedics;  Laterality: Right; ? ?BMI   ? Body Mass Index: 28.13 kg/m?  ?  ? ? Reproductive/Obstetrics ?negative OB ROS ? ?  ? ? ? ? ? ? ? ? ? ? ? ? ? ?  ?  ? ? ? ? ? ? ? ? ?Anesthesia Physical ?Anesthesia Plan ? ?ASA: 3 ? ?Anesthesia Plan: General  ? ?Post-op Pain Management:   ? ?Induction: Intravenous ? ?PONV Risk Score and Plan: Ondansetron, Dexamethasone, Midazolam and Treatment may vary due to age or medical condition ? ?Airway Management Planned: Natural Airway and Nasal Cannula ? ?Additional Equipment:  ? ?Intra-op Plan:  ? ?Post-operative Plan:  ? ?Informed Consent: I have  reviewed the patients History and Physical, chart, labs and discussed the procedure including the risks, benefits and alternatives for the proposed anesthesia with the patient or authorized representative who has indicated his/her understanding and acceptance.  ? ? ? ?Dental Advisory Given ? ?Plan Discussed with: Anesthesiologist, CRNA and Surgeon ? ?Anesthesia Plan Comments: (Patient consented for risks of anesthesia including but not limited to:  ?- adverse reactions to medications ?- risk of airway placement if required ?- damage to eyes, teeth, lips or other oral mucosa ?- nerve damage due to positioning  ?- sore throat or hoarseness ?- Damage to heart, brain, nerves, lungs, other parts of body or loss of life ? ?Patient voiced understanding.)  ? ? ? ? ? ? ?Anesthesia Quick Evaluation ? ?

## 2022-02-01 NOTE — Anesthesia Postprocedure Evaluation (Signed)
Anesthesia Post Note ? ?Patient: Joel Galvan ? ?Procedure(s) Performed: COLONOSCOPY WITH PROPOFOL ? ?Patient location during evaluation: Endoscopy ?Anesthesia Type: General ?Level of consciousness: awake and alert ?Pain management: pain level controlled ?Vital Signs Assessment: post-procedure vital signs reviewed and stable ?Respiratory status: spontaneous breathing, nonlabored ventilation, respiratory function stable and patient connected to nasal cannula oxygen ?Cardiovascular status: blood pressure returned to baseline and stable ?Postop Assessment: no apparent nausea or vomiting ?Anesthetic complications: no ? ? ?No notable events documented. ? ? ?Last Vitals:  ?Vitals:  ? 02/01/22 0939 02/01/22 0950  ?BP: 117/79 124/79  ?Pulse: 68 (!) 58  ?Resp: 18 15  ?Temp:    ?SpO2: 99% 99%  ?  ?Last Pain:  ?Vitals:  ? 02/01/22 0950  ?TempSrc:   ?PainSc: 0-No pain  ? ? ?  ?  ?  ?  ?  ?  ? ?Precious Haws [name] ? ? ? ? ?

## 2022-02-01 NOTE — Transfer of Care (Signed)
Immediate Anesthesia Transfer of Care Note ? ?Patient: Joel Galvan ? ?Procedure(s) Performed: Procedure(s): ?COLONOSCOPY WITH PROPOFOL (N/A) ? ?Patient Location: PACU and Endoscopy Unit ? ?Anesthesia Type:General ? ?Level of Consciousness: sedated ? ?Airway & Oxygen Therapy: Patient Spontanous Breathing and Patient connected to nasal cannula oxygen ? ?Post-op Assessment: Report given to RN and Post -op Vital signs reviewed and stable ? ?Post vital signs: Reviewed and stable ? ?Last Vitals:  ?Vitals:  ? 02/01/22 0808 02/01/22 0929  ?BP: 106/79 114/69  ?Pulse: 63 (!) 57  ?Resp: 18 15  ?Temp: (!) 35.7 ?C   ?SpO2: 97% 98%  ? ? ?Complications: No apparent anesthesia complications ?

## 2022-02-01 NOTE — H&P (Signed)
?Joel Repress, MD ?[address]  ?[address]  ?[place], [place] [address]  ?Main: [phone]  ?Fax: [phone] ?Pager: [phone] ? ?Primary Care Physician:  Danella Penton, MD ?Primary Gastroenterologist:  Dr. Arlyss Galvan ? ?Pre-Procedure History & Physical: ?HPI:  Joel Galvan is a 76 y.o. male is here for an colonoscopy. ?  ?Past Medical History:  ?Diagnosis Date  ? Bursitis   ? Chest pain, atypical   ? a. 03/2006 Cath: EF 55%, mild luminal irregularities; b. 01/2009 Lexi MV: EF 64%, fixed inferior defect w normal wall motion, likely diaphragmatic attenuation. No ischemia;  c. 2014 Ex MV: no ischemia-->c/p improved w/ PPI;  d. 01/2015 ETT: Ex time 9 mins, Max HR 141, no st/t changes; e. 01/2017 Ex MV: No isch, EF 46% (artifact - no by echo); f. 04/2020 Cor CTA: LAD 0-25p. Otw nl cors. Cor Ca2+ = 85.7.  ? CNS (central nervous system disease)   ? AVM s/p surgery in 1996  ? GERD (gastroesophageal reflux disease)   ? History of echocardiogram   ? a. 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 03/2017 Echo: EF 55-60%, no rwma, Asc Ao 3.68mm, nl RV fxn, mildly dil RA.  ? Hyperlipidemia   ? Hyperuricemia   ? Osteoarthritis   ? hands and feet  ? Plantar fasciitis   ? Sinusitis   ? Syncope and collapse   ? history  ? Tendonitis   ? ? ?Past Surgical History:  ?Procedure Laterality Date  ? APPENDECTOMY  1958  ? BALLOON SINUPLASTY  11/29/2017  ? Dr Wyn Forster office  ? CARDIAC CATHETERIZATION  2007  ? cone  ? CRANIOTOMY  1996  ? for AVM  ? ESOPHAGOGASTRODUODENOSCOPY (EGD) WITH PROPOFOL N/A 01/10/2022  ? Procedure: ESOPHAGOGASTRODUODENOSCOPY (EGD) WITH PROPOFOL;  Surgeon: Toney Reil, MD;  Location: [facility] ENDOSCOPY;  Service: Gastroenterology;  Laterality: N/A;  ? FRACTURE SURGERY Left   ? arm  ? HEMORRHOID SURGERY    ? KNEE ARTHROSCOPY WITH MENISCAL REPAIR Left 10/04/2018  ?  Procedure: KNEE ARTHROSCOPY WITH MENISCAL REPAIR;  Surgeon: Signa Kell, MD;  Location: [facility];  Service: Orthopedics;  Laterality: Left;  ? Right hip replacement  1999  ? 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 ROTATOR CUFF REPAIR Right 10/08/2014  ? TONSILLECTOMY  1966  ? TOTAL HIP REVISION Right 08/27/2019  ? Procedure: TOTAL HIP REVISION POLY EXCHANGE;  Surgeon: Kennedy Bucker, MD;  Location: [facility] ORS;  Service: Orthopedics;  Laterality: Right;  ? ? ?Prior to Admission medications   ?Medication Sig Start Date End Date Taking? Authorizing Provider  ?atorvastatin (LIPITOR) 10 MG tablet Take 0.5 tablets (5 mg total) by mouth daily. In the morning ?Patient taking differently: Take 5 mg by mouth. Takes Monday, Wednesday and Friday weekly. 02/23/21  Yes Iran Ouch, MD  ?ezetimibe (ZETIA) 10 MG tablet Take 10 mg by mouth daily. In the mornings.   Yes [provider]  ?pantoprazole (PROTONIX) 40 MG tablet TAKE 1 TABLET (40 MG TOTAL) BY MOUTH DAILY. 01/17/17  Yes Iran Ouch, MD  ?acetaminophen (TYLENOL) 325 MG tablet Take 325 mg by mouth every 4 (four) hours as needed.    [provider]  ?chlorhexidine (PERIDEX) 0.12 % solution Use as directed 5 mLs in the mouth or throat 4 (four) times daily. 01/05/22   [provider]  ? ? ?  Allergies as of 01/10/2022 - Review Complete 01/10/2022  ?Allergen Reaction Noted  ? Ibuprofen Nausea And Vomiting 06/23/2021  ? Methocarbamol Hives 06/13/2008  ? Prednisone Other (See Comments) 06/13/2008  ? ? ?Family History  ?Problem Relation Age of Onset  ? Heart attack Father 23  ? ? ?Social History  ? ?Socioeconomic History  ? Marital status: Married  ?  Spouse name: Not on file  ? Number of children: Not on file  ? Years of education: Not on file  ? Highest education level: Not on file  ?Occupational History  ? Not on file  ?Tobacco Use  ? Smoking status: Never  ? Smokeless tobacco: Never  ?Vaping Use  ? Vaping Use: Never used  ?Substance  and Sexual Activity  ? Alcohol use: Yes  ?  Comment: avg: 1 drink/week  ? Drug use: No  ? Sexual activity: Not on file  ?Other Topics Concern  ? Not on file  ?Social History Narrative  ? 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   ? ?Social Determinants of Health  ? ?Financial Resource Strain: Not on file  ?Food Insecurity: Not on file  ?Transportation Needs: Not on file  ?Physical Activity: Not on file  ?Stress: Not on file  ?Social Connections: Not on file  ?Intimate Partner Violence: Not on file  ? ? ?Review of Systems: ?See HPI, otherwise negative ROS ? ?Physical Exam: ?BP 106/79   Pulse 63   Temp (!) 96.3 ?F (35.7 ?C) (Temporal)   Resp 18   Ht 5\' 8"  (1.727 m)   Wt 83.9 kg   SpO2 97%   BMI 28.13 kg/m?  ?General:   Alert,  pleasant and cooperative in NAD ?Head:  Normocephalic and atraumatic. ?Neck:  Supple; no masses or thyromegaly. ?Lungs:  Clear throughout to auscultation.    ?Heart:  Regular rate and rhythm. ?Abdomen:  Soft, nontender and nondistended. Normal bowel sounds, without guarding, and without rebound.   ?Neurologic:  Alert and  oriented x4;  grossly normal neurologically. ? ?Impression/Plan: ?Joel Galvan is here for an colonoscopy to be performed for melena ? ?Risks, benefits, limitations, and alternatives regarding  colonoscopy have been reviewed with the patient.  Questions have been answered.  All parties agreeable. ? ? ?Martyn Ehrich, MD  02/01/2022, 9:02 AM ?

## 2022-02-01 NOTE — Op Note (Signed)
[facility] ?Gastroenterology ?Patient Name: Joel Galvan ?Procedure Date: 02/01/2022 9:04 AM ?MRN: [id number] ?Account #: [id number] ?Date of Birth: 11-Mar-1946 ?Admit Type: Outpatient ?Age: 76 ?Room: [facility] ENDO ROOM 3 ?Gender: Male ?Note Status: Finalized ?Instrument Name: Colonoscope [id number] ?Procedure:             Colonoscopy ?Indications:           Last colonoscopy: August 2005, Melena ?Providers:             Toney Reil MD, MD ?Referring MD:          Danella Penton, MD (Referring MD) ?Medicines:             General Anesthesia ?Complications:         No immediate complications. Estimated blood loss: None. ?Procedure:             Pre-Anesthesia Assessment: ?                       - Prior to the procedure, a History and Physical was  ?                       performed, and patient medications and allergies were  ?                       reviewed. The patient is competent. The risks and  ?                       benefits of the procedure and the sedation options and  ?                       risks were discussed with the patient. All questions  ?                       were answered and informed consent was obtained.  ?                       Patient identification and proposed procedure were  ?                       verified by the physician, the nurse, the  ?                       anesthesiologist, the anesthetist and the technician  ?                       in the pre-procedure area in the procedure room in the  ?                       endoscopy suite. Mental Status Examination: alert and  ?                       oriented. Airway Examination: normal oropharyngeal  ?                       airway and neck mobility. Respiratory Examination:  ?                       clear to auscultation. CV Examination: normal.  ?  Prophylactic Antibiotics: The patient does not require  ?                       prophylactic antibiotics. Prior Anticoagulants: The  ?                       patient  has taken no previous anticoagulant or  ?                       antiplatelet agents. ASA Grade Assessment: III - A  ?                       patient with severe systemic disease. After reviewing  ?                       the risks and benefits, the patient was deemed in  ?                       satisfactory condition to undergo the procedure. The  ?                       anesthesia plan was to use general anesthesia.  ?                       Immediately prior to administration of medications,  ?                       the patient was re-assessed for adequacy to receive  ?                       sedatives. The heart rate, respiratory rate, oxygen  ?                       saturations, blood pressure, adequacy of pulmonary  ?                       ventilation, and response to care were monitored  ?                       throughout the procedure. The physical status of the  ?                       patient was re-assessed after the procedure. ?                       After obtaining informed consent, the colonoscope was  ?                       passed under direct vision. Throughout the procedure,  ?                       the patient's blood pressure, pulse, and oxygen  ?                       saturations were monitored continuously. The  ?                       Colonoscope was introduced through the anus and  ?  advanced to the the terminal ileum, with  ?                       identification of the appendiceal orifice and IC  ?                       valve. The colonoscopy was performed without  ?                       difficulty. The patient tolerated the procedure well.  ?                       The quality of the bowel preparation was evaluated  ?                       using the BBPS [facility] Bowel Preparation Scale) with  ?                       scores of: Right Colon = 3, Transverse Colon = 3 and  ?                       Left Colon = 3 (entire mucosa seen well with no  ?                       residual  staining, small fragments of stool or opaque  ?                       liquid). The total BBPS score equals 9. ?Findings: ?     The perianal and digital rectal examinations were normal. Pertinent  ?     negatives include normal sphincter tone and no palpable rectal lesions. ?     The terminal ileum appeared normal. ?     The entire examined colon appeared normal. ?     The retroflexed view of the distal rectum and anal verge was normal and  ?     showed no anal or rectal abnormalities. ?Impression:            - The examined portion of the ileum was normal. ?                       - The entire examined colon is normal. ?                       - The distal rectum and anal verge are normal on  ?                       retroflexion view. ?                       - No specimens collected. ?Recommendation:        - Discharge patient to home (with escort). ?                       - Resume previous diet today. ?                       - Continue present medications. ?Procedure Code(s):     --- Professional --- ?  45378, Colonoscopy, flexible; diagnostic, [id number]  ?                       collection of specimen(s) by brushing or washing, when  ?                       performed (separate procedure) ?Diagnosis Code(s):     --- Professional --- ?                       K92.1, Melena (includes Hematochezia) ?CPT copyright 2019 American Medical Association. All rights reserved. ?The codes documented in this report are preliminary and upon coder review may  ?be revised to meet current compliance requirements. ?Dr. Libby Mawonini Jimmy Plessinger ?Itay Mella Alger Memoseddy Fitzpatrick Alberico MD, MD ?02/01/2022 9:28:31 AM ?This report has been signed electronically. ?Number of Addenda: 0 ?Note Initiated On: 02/01/2022 9:04 AM ?Scope Withdrawal Time: 0 hours 5 minutes 54 seconds  ?Total Procedure Duration: 0 hours 8 minutes 46 seconds  ?Estimated Blood Loss:  Estimated blood loss: none. ?     [facility] ?

## 2022-02-01 NOTE — Anesthesia Procedure Notes (Signed)
Date/Time: 02/01/2022 9:18 AM ?Performed by: Doreen Salvage, CRNA ?Pre-anesthesia Checklist: Patient identified, Emergency Drugs available, Suction available and Patient being monitored ?Patient Re-evaluated:Patient Re-evaluated prior to induction ?Oxygen Delivery Method: Nasal cannula ?Induction Type: IV induction ?Dental Injury: Teeth and Oropharynx as per pre-operative assessment  ?Comments: Nasal cannula with etCO2 monitoring ? ? ? ? ?

## 2022-02-02 ENCOUNTER — Encounter: Payer: Self-pay | Admitting: Gastroenterology

## 2022-02-15 ENCOUNTER — Ambulatory Visit: Payer: Medicare PPO | Admitting: Cardiovascular Disease

## 2022-02-15 ENCOUNTER — Encounter: Payer: Self-pay | Admitting: Cardiovascular Disease

## 2022-02-15 VITALS — BP 120/82 | HR 66 | Ht 68.0 in | Wt 198.0 lb

## 2022-02-15 DIAGNOSIS — E785 Hyperlipidemia, unspecified: Secondary | ICD-10-CM

## 2022-02-15 DIAGNOSIS — I2510 Atherosclerotic heart disease of native coronary artery without angina pectoris: Secondary | ICD-10-CM | POA: Diagnosis not present

## 2022-02-15 DIAGNOSIS — R55 Syncope and collapse: Secondary | ICD-10-CM
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
## 2022-02-15 NOTE — Patient Instructions (Signed)

## 2022-08-18 ENCOUNTER — Ambulatory Visit: Payer: Medicare PPO | Attending: Cardiovascular Disease | Admitting: Cardiovascular Disease

## 2022-08-18 ENCOUNTER — Encounter: Payer: Self-pay | Admitting: Cardiovascular Disease

## 2022-08-18 VITALS — BP 110/80 | HR 62 | Ht 68.0 in | Wt 194.4 lb

## 2022-08-18 DIAGNOSIS — I2510 Atherosclerotic heart disease of native coronary artery without angina pectoris: Secondary | ICD-10-CM

## 2022-08-18 DIAGNOSIS — I25118 Atherosclerotic heart disease of native coronary artery with other forms of angina pectoris: Secondary | ICD-10-CM

## 2022-08-18 DIAGNOSIS — I951 Orthostatic hypotension: Secondary | ICD-10-CM

## 2022-08-18 DIAGNOSIS — R0609 Other forms of dyspnea: Secondary | ICD-10-CM

## 2022-08-18 DIAGNOSIS — E782 Mixed hyperlipidemia: Secondary | ICD-10-CM | POA: Diagnosis not present
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
## 2022-08-18 NOTE — Patient Instructions (Signed)
Medication Instructions:  No changes *If you need a refill on your cardiac medications before your next appointment, please call your pharmacy*   Lab Work: None ordered If you have labs (blood work) drawn today and your tests are completely normal, you will receive your results only by: MyChart Message (if you have MyChart) OR A paper copy in the mail If you have any lab test that is abnormal or we need to change your treatment, we will call you to review the results.   Testing/Procedures: Your provider has ordered a Exercise Myoview Stress test. This will take place at [facility]. Please report to the [facility] medical mall entrance. The volunteers at the first desk will direct you where to go.  [facility] MYOVIEW  Your provider has ordered a Stress Test with nuclear imaging. The purpose of this test is to evaluate the blood supply to your heart muscle. This procedure is referred to as a "Non-Invasive Stress Test." This is because other than having an IV started in your vein, nothing is inserted or "invades" your body. Cardiac stress tests are done to find areas of poor blood flow to the heart by determining the extent of coronary artery disease (CAD). Some patients exercise on a treadmill, which naturally increases the blood flow to your heart, while others who are unable to walk on a treadmill due to physical limitations will have a pharmacologic/chemical stress agent called Lexiscan . This medicine will mimic walking on a treadmill by temporarily increasing your coronary blood flow.   Please note: these test may take anywhere between 2-4 hours to complete  How to prepare for your Myoview test:  Do not eat or drink after midnight No caffeine for 24 hours prior to test No smoking 24 hours prior to test. Your medication may be taken with water.  If your doctor stopped a medication because of this test, do not take that medication. Ladies, please do not wear dresses.  Skirts or pants are appropriate.  Please wear a short sleeve shirt. No perfume, cologne or lotion. Wear comfortable walking shoes. No heels!   PLEASE NOTIFY THE OFFICE AT LEAST 24 HOURS IN ADVANCE IF YOU ARE UNABLE TO KEEP YOUR APPOINTMENT.  [phone] AND  PLEASE NOTIFY NUCLEAR MEDICINE AT [facility] AT LEAST 24 HOURS IN ADVANCE IF YOU ARE UNABLE TO KEEP YOUR APPOINTMENT. [phone]    Follow-Up: At [facility], you and your health needs are our priority.  As part of our continuing mission to provide you with exceptional heart care, we have created designated Provider Care Teams.  These Care Teams include your primary Cardiologist (physician) and Advanced Practice Providers (APPs -  Physician Assistants and Nurse Practitioners) who all work together to provide you with the care you need, when you need it.  We recommend signing up for the patient portal called "MyChart".  Sign up information is provided on this After Visit Summary.  MyChart is used to connect with patients for Virtual Visits (Telemedicine).  Patients are able to view lab/test results, encounter notes, upcoming appointments, etc.  Non-urgent messages can be sent to your provider as well.   To learn more about what you can do with MyChart, go to [email or web address].    Your next appointment:   6 month(s)  The format for your next appointment:   In Person  Provider:   You may see Lorine Bears, MD or one of the following Advanced Practice Providers on your designated Care Team:   Nicolasa Ducking, NP Eula Listen,  PA-C Cadence Fransico Michael, PA-C Charlsie Quest, NP

## 2022-08-24 ENCOUNTER — Encounter
Admission: RE | Admit: 2022-08-24 | Discharge: 2022-08-24 | Disposition: A | Discharge status: Home / Self Care | Payer: Medicare PPO | Source: Ambulatory Visit | Attending: Cardiovascular Disease | Admitting: Cardiovascular Disease

## 2022-08-24 DIAGNOSIS — R0609 Other forms of dyspnea: Secondary | ICD-10-CM | POA: Diagnosis present

## 2022-08-24 LAB — NM MYOCAR MULTI W/SPECT W/WALL MOTION / EF
Angina Index: 0
Duke Treadmill Score: 9
Estimated workload: 10.1
Exercise duration (min): 8 min
Exercise duration (sec): 39 s
LV dias vol: 77 mL (ref 62–150)
LV sys vol: 26 mL
MPHR: 155 {beats}/min
Nuc Stress EF: 66 %
Peak HR: 137 {beats}/min
Percent HR: 94 %
Rest HR: 63 {beats}/min
Rest Nuclear Isotope Dose: 10.3 mCi
SDS: 0
SRS: 8
SSS: 2
ST Depression (mm): 0 mm
Stress Nuclear Isotope Dose: 30.6 mCi
TID: 0.73

## 2022-08-24 MED ORDER — TECHNETIUM TC 99M TETROFOSMIN IV KIT
10.3000 | PACK | Freq: Once | INTRAVENOUS | Status: AC | PRN
  Administered 2022-08-24: 10.3 via INTRAVENOUS

## 2022-08-24 MED ORDER — TECHNETIUM TC 99M TETROFOSMIN IV KIT
30.6400 | PACK | Freq: Once | INTRAVENOUS | Status: AC | PRN
  Administered 2022-08-24: 30.64 via INTRAVENOUS

## 2022-09-28 ENCOUNTER — Encounter: Payer: Self-pay | Admitting: Podiatry

## 2022-10-07 ENCOUNTER — Other Ambulatory Visit: Payer: Self-pay | Admitting: Podiatry

## 2022-10-07 NOTE — Discharge Instructions (Signed)
[facility]  POST OPERATIVE INSTRUCTIONS FOR DR. FOWLER AND [facility] PODIATRY DEPARTMENT   Take your medication as prescribed.  Pain medication should be taken only as needed.  Keep the dressing clean, dry and intact.  Keep your foot elevated above the heart level for the first 48 hours.  Walking to the bathroom and brief periods of walking are acceptable, unless we have instructed you to be non-weight bearing.  Always wear your post-op shoe when walking.  Always use your crutches if you are to be non-weight bearing.  Do not take a shower. Baths are permissible as long as the foot is kept out of the water.   Every hour you are awake:  Bend your knee 15 times. Flex foot 15 times Massage calf 15 times  Call [facility] ([phone]) if any of the following problems occur: You develop a temperature or fever. The bandage becomes saturated with blood. Medication does not stop your pain. Injury of the foot occurs. Any symptoms of infection including redness, odor, or red streaks running from wound. 

## 2022-10-13 ENCOUNTER — Ambulatory Visit
Admission: RE | Admit: 2022-10-13 | Discharge: 2022-10-13 | Disposition: A | Discharge status: Home / Self Care | Payer: Medicare PPO | Source: Ambulatory Visit | Attending: Podiatry | Admitting: Podiatry

## 2022-10-13 ENCOUNTER — Encounter: Payer: Self-pay | Admitting: Podiatry

## 2022-10-13 ENCOUNTER — Ambulatory Visit: Payer: Self-pay

## 2022-10-13 ENCOUNTER — Other Ambulatory Visit: Payer: Self-pay

## 2022-10-13 ENCOUNTER — Ambulatory Visit: Payer: Medicare PPO | Admitting: General Practice

## 2022-10-13 ENCOUNTER — Encounter
Admission: RE | Disposition: A | Discharge status: Home / Self Care | Payer: Self-pay | Source: Ambulatory Visit | Attending: Podiatry

## 2022-10-13 DIAGNOSIS — I2510 Atherosclerotic heart disease of native coronary artery without angina pectoris: Secondary | ICD-10-CM | POA: Insufficient documentation

## 2022-10-13 DIAGNOSIS — K219 Gastro-esophageal reflux disease without esophagitis: Secondary | ICD-10-CM | POA: Diagnosis not present

## 2022-10-13 DIAGNOSIS — M1A9XX1 Chronic gout, unspecified, with tophus (tophi): Secondary | ICD-10-CM | POA: Diagnosis not present

## 2022-10-13 DIAGNOSIS — M205X1 Other deformities of toe(s) (acquired), right foot: Secondary | ICD-10-CM

## 2022-10-13 DIAGNOSIS — M2011 Hallux valgus (acquired), right foot: Secondary | ICD-10-CM | POA: Diagnosis present

## 2022-10-13 HISTORY — PX: BUNIONECTOMY: SHX129

## 2022-10-13 SURGERY — BUNIONECTOMY
Anesthesia: General | Site: Toe | Laterality: Right

## 2022-10-13 MED ORDER — HEPARIN SODIUM (PORCINE) 1000 UNIT/ML IJ SOLN
INTRAMUSCULAR | Status: AC
  Filled 2022-10-13: qty 20

## 2022-10-13 MED ORDER — BUPIVACAINE HCL 0.5 % IJ SOLN
INTRAMUSCULAR | Status: DC | PRN
  Administered 2022-10-13: 10 mL

## 2022-10-13 MED ORDER — OXYCODONE HCL 5 MG PO TABS: 5.0000 mg | ORAL_TABLET | Freq: Once | ORAL | Status: DC | PRN

## 2022-10-13 MED ORDER — BUPIVACAINE LIPOSOME 1.3 % IJ SUSP
INTRAMUSCULAR | Status: DC | PRN
  Administered 2022-10-13: 10 mL

## 2022-10-13 MED ORDER — HYDROMORPHONE HCL 1 MG/ML IJ SOLN: 0.2500 mg | INTRAMUSCULAR | Status: DC | PRN

## 2022-10-13 MED ORDER — OXYCODONE HCL 5 MG/5ML PO SOLN: 5.0000 mg | Freq: Once | ORAL | Status: DC | PRN

## 2022-10-13 MED ORDER — 0.9 % SODIUM CHLORIDE (POUR BTL) OPTIME
TOPICAL | Status: DC | PRN
  Administered 2022-10-13: 500 mL

## 2022-10-13 MED ORDER — HYDROCODONE-ACETAMINOPHEN 7.5-325 MG PO TABS: 1.0000 | ORAL_TABLET | Freq: Four times a day (QID) | ORAL | 0 refills | Status: AC | PRN

## 2022-10-13 MED ORDER — LIDOCAINE HCL (CARDIAC) PF 100 MG/5ML IV SOSY
PREFILLED_SYRINGE | INTRAVENOUS | Status: DC | PRN
  Administered 2022-10-13: 40 mg via INTRAVENOUS

## 2022-10-13 MED ORDER — FENTANYL CITRATE (PF) 100 MCG/2ML IJ SOLN
INTRAMUSCULAR | Status: DC | PRN
  Administered 2022-10-13 (×4): 25 ug via INTRAVENOUS

## 2022-10-13 MED ORDER — PHENYLEPHRINE HCL (PRESSORS) 10 MG/ML IV SOLN
INTRAVENOUS | Status: AC
  Filled 2022-10-13: qty 1

## 2022-10-13 MED ORDER — LACTATED RINGERS IV SOLN: INTRAVENOUS | Status: DC

## 2022-10-13 MED ORDER — PHENYLEPHRINE 80 MCG/ML (10ML) SYRINGE FOR IV PUSH (FOR BLOOD PRESSURE SUPPORT)
PREFILLED_SYRINGE | INTRAVENOUS | Status: AC
  Filled 2022-10-13: qty 10

## 2022-10-13 MED ORDER — NOREPINEPHRINE 4 MG/250ML-% IV SOLN
INTRAVENOUS | Status: AC
  Filled 2022-10-13: qty 250

## 2022-10-13 MED ORDER — VASOPRESSIN 20 UNIT/ML IV SOLN
INTRAVENOUS | Status: AC
  Filled 2022-10-13: qty 1

## 2022-10-13 MED ORDER — CEFAZOLIN SODIUM-DEXTROSE 2-4 GM/100ML-% IV SOLN
2.0000 g | INTRAVENOUS | Status: AC
  Administered 2022-10-13: 2 g via INTRAVENOUS

## 2022-10-13 MED ORDER — EPHEDRINE 5 MG/ML INJ
INTRAVENOUS | Status: AC
  Filled 2022-10-13: qty 5

## 2022-10-13 MED ORDER — PROPOFOL 500 MG/50ML IV EMUL
INTRAVENOUS | Status: DC | PRN
  Administered 2022-10-13: 125 ug/kg/min via INTRAVENOUS

## 2022-10-13 MED ORDER — MIDAZOLAM HCL 2 MG/2ML IJ SOLN
INTRAMUSCULAR | Status: DC | PRN
  Administered 2022-10-13: 2 mg via INTRAVENOUS

## 2022-10-13 MED ORDER — AMOXICILLIN-POT CLAVULANATE 875-125 MG PO TABS: 1.0000 | ORAL_TABLET | Freq: Two times a day (BID) | ORAL | 0 refills | Status: AC

## 2022-10-13 SURGICAL SUPPLY — 41 items
BIT DRILL 1.7 LNG CANN (DRILL) IMPLANT
BLADE OSCILLATING/SAGITTAL (BLADE) ×1
BLADE SW THK.38XMED LNG THN (BLADE) IMPLANT
BNDG CMPR 5X4 CHSV STRCH STRL (GAUZE/BANDAGES/DRESSINGS) ×1
BNDG CMPR STD VLCR NS LF 5.8X4 (GAUZE/BANDAGES/DRESSINGS) ×1
BNDG CMPR STD VLCR NS LF 5.8X6 (GAUZE/BANDAGES/DRESSINGS) ×1
BNDG COHESIVE 4X5 TAN STRL LF (GAUZE/BANDAGES/DRESSINGS) ×1 IMPLANT
BNDG ELASTIC 4X5.8 VLCR NS LF (GAUZE/BANDAGES/DRESSINGS) ×1 IMPLANT
BNDG ELASTIC 6X5.8 VLCR NS LF (GAUZE/BANDAGES/DRESSINGS) ×1 IMPLANT
BNDG GAUZE DERMACEA FLUFF 4 (GAUZE/BANDAGES/DRESSINGS) ×1 IMPLANT
BNDG GZE DERMACEA 4 6PLY (GAUZE/BANDAGES/DRESSINGS) ×1
BOOT STEPPER DURA MED (SOFTGOODS) IMPLANT
CANISTER SUCT 1200ML W/VALVE (MISCELLANEOUS) ×1 IMPLANT
COUNTERSINK HEADED 2.5 (ORTHOPEDIC DISPOSABLE SUPPLIES) ×1
COVER LIGHT HANDLE UNIVERSAL (MISCELLANEOUS) ×2 IMPLANT
DRAPE FLUOR MINI C-ARM 54X84 (DRAPES) ×1 IMPLANT
DURAPREP 26ML APPLICATOR (WOUND CARE) ×1 IMPLANT
ELECT REM PT RETURN 9FT ADLT (ELECTROSURGICAL) ×1
ELECTRODE REM PT RTRN 9FT ADLT (ELECTROSURGICAL) ×1 IMPLANT
GAUZE SPONGE 4X4 12PLY STRL (GAUZE/BANDAGES/DRESSINGS) ×1 IMPLANT
GAUZE XEROFORM 1X8 LF (GAUZE/BANDAGES/DRESSINGS) ×1 IMPLANT
GLOVE BIOGEL PI IND STRL 7.5 (GLOVE) ×1 IMPLANT
GLOVE SURG SS PI 7.0 STRL IVOR (GLOVE) ×1 IMPLANT
GOWN STRL REUS W/ TWL LRG LVL3 (GOWN DISPOSABLE) ×2 IMPLANT
GOWN STRL REUS W/TWL LRG LVL3 (GOWN DISPOSABLE) ×2
KIT TURNOVER KIT A (KITS) ×1 IMPLANT
NS IRRIG 500ML POUR BTL (IV SOLUTION) ×1 IMPLANT
PACK EXTREMITY ARMC (MISCELLANEOUS) ×1 IMPLANT
PADDING CAST BLEND 4X4 NS (MISCELLANEOUS) ×3 IMPLANT
SCREW CANN SHRT THRD 2.5X26 (Screw) IMPLANT
SCREW COUNTERSINK HEADED 2.5 (ORTHOPEDIC DISPOSABLE SUPPLIES) IMPLANT
SPLINT CAST 1 STEP 4X30 (MISCELLANEOUS) ×1 IMPLANT
STOCKINETTE IMPERVIOUS LG (DRAPES) ×1 IMPLANT
SUT ETHILON 4-0 (SUTURE) ×1
SUT ETHILON 4-0 FS2 18XMFL BLK (SUTURE) ×1
SUT VIC AB 2-0 SH 27 (SUTURE) ×1
SUT VIC AB 2-0 SH 27XBRD (SUTURE) IMPLANT
SUT VIC AB 3-0 SH 27 (SUTURE) ×1
SUT VIC AB 3-0 SH 27X BRD (SUTURE) IMPLANT
SUTURE ETHLN 4-0 FS2 18XMF BLK (SUTURE) IMPLANT
WIRE SMOOTH TROCAR .9MMX150MML (WIRE) IMPLANT

## 2022-10-13 NOTE — Anesthesia Preprocedure Evaluation (Signed)
Anesthesia Evaluation  Patient identified by MRN, date of birth, ID band Patient awake    Reviewed: Allergy & Precautions, NPO status , Patient's Chart, lab work & pertinent test results  History of Anesthesia Complications Negative for: history of anesthetic complications  Airway Mallampati: II  TM Distance: >3 FB Neck ROM: full    Dental  (+) Chipped   Pulmonary neg pulmonary ROS   Pulmonary exam normal        Cardiovascular + CAD  Normal cardiovascular exam  Hx of atypical chest pain with multiple negative stress tests, negative LHC Orthostatic hypotension    Neuro/Psych negative neurological ROS  negative psych ROS   GI/Hepatic Neg liver ROS,GERD  ,,  Endo/Other  negative endocrine ROS    Renal/GU negative Renal ROS  negative genitourinary   Musculoskeletal  (+) Arthritis , Osteoarthritis,    Abdominal   Peds  Hematology negative hematology ROS (+)   Anesthesia Other Findings Past Medical History: No date: Bursitis No date: Chest pain, atypical     Comment:  a. 03/2006 Cath: EF 55%, mild luminal irregularities; b.               01/2009 Lexi MV: EF 64%, fixed inferior defect w normal               wall motion, likely diaphragmatic attenuation. No               ischemia;  c. 2014 Ex MV: no ischemia-->c/p improved w/               PPI;  d. 01/2015 ETT: Ex time 9 mins, Max HR 141, no st/t               changes; e. 01/2017 Ex MV: No isch, EF 46% (artifact - no               by echo); f. 04/2020 Cor CTA: LAD 0-25p. Otw nl cors. Cor               Ca2+ = 85.7. No date: CNS (central nervous system disease)     Comment:  AVM s/p surgery in 1996 No date: GERD (gastroesophageal reflux disease) No date: History of echocardiogram     Comment:  a. 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 Fletcher Anon - felt to be Ca2+  trabeculation), mildly dil              LA; b. 03/2017 Echo: EF 55-60%, no rwma, Asc Ao 3.51mm, nl               RV fxn, mildly dil RA. No date: Hyperlipidemia No date: Hyperuricemia No date: Osteoarthritis     Comment:  hands and feet No date: Plantar fasciitis No date: Sinusitis No date: Syncope and collapse     Comment:  history No date: Tendonitis  Past Surgical History: 1958: APPENDECTOMY 11/29/2017: BALLOON SINUPLASTY     Comment:  Dr Maisie Fus office 2007: CARDIAC CATHETERIZATION     Comment:  cone 02/01/2022: COLONOSCOPY WITH PROPOFOL; N/A     Comment:  Procedure: COLONOSCOPY WITH PROPOFOL;  Surgeon: Lin Landsman, MD;  Location: [place];  Service:  Gastroenterology;  Laterality: N/A; 1996: CRANIOTOMY     Comment:  for AVM 01/10/2022: ESOPHAGOGASTRODUODENOSCOPY (EGD) WITH PROPOFOL; N/A     Comment:  Procedure: ESOPHAGOGASTRODUODENOSCOPY (EGD) WITH               PROPOFOL;  Surgeon: Lin Landsman, MD;  Location:               [facility] ENDOSCOPY;  Service: Gastroenterology;  Laterality:               N/A; No date: FRACTURE SURGERY; Left     Comment:  arm No date: HEMORRHOID SURGERY 10/04/2018: KNEE ARTHROSCOPY WITH MENISCAL REPAIR; Left     Comment:  Procedure: KNEE ARTHROSCOPY WITH MENISCAL REPAIR;                Surgeon: Leim Fabry, MD;  Location: [place];  Service: Orthopedics;  Laterality: Left; 1999: Right hip replacement     Comment:  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  10/08/2014: ROTATOR CUFF REPAIR; Right 1966: TONSILLECTOMY 08/27/2019: TOTAL HIP REVISION; Right     Comment:  Procedure: TOTAL HIP REVISION POLY EXCHANGE;  Surgeon:               Hessie Knows, MD;  Location: [facility];  Service:               Orthopedics;  Laterality: Right;  BMI    Body Mass Index: 27.83 kg/m      Reproductive/Obstetrics negative OB ROS                             Anesthesia  Physical Anesthesia Plan  ASA: 3  Anesthesia Plan: General   Post-op Pain Management: Regional block   Induction: Intravenous  PONV Risk Score and Plan: Propofol infusion and TIVA  Airway Management Planned: Natural Airway and Nasal Cannula  Additional Equipment:   Intra-op Plan:   Post-operative Plan:   Informed Consent: I have reviewed the patients History and Physical, chart, labs and discussed the procedure including the risks, benefits and alternatives for the proposed anesthesia with the patient or authorized representative who has indicated his/her understanding and acceptance.     Dental Advisory Given  Plan Discussed with: Anesthesiologist, CRNA and Surgeon  Anesthesia Plan Comments: (Patient consented for risks of anesthesia including but not limited to:  - adverse reactions to medications - risk of airway placement if required - damage to eyes, teeth, lips or other oral mucosa - nerve damage due to positioning  - sore throat or hoarseness - Damage to heart, brain, nerves, lungs, other parts of body or loss of life  Patient voiced understanding.)       Anesthesia Quick Evaluation

## 2022-10-13 NOTE — H&P (Signed)
HISTORY AND PHYSICAL INTERVAL NOTE:  10/13/2022  9:51 AM  Joel Galvan  has presented today for surgery, with the diagnosis of M1A.00X1 - Chronic gouty arthritis M20.11 - Hallux valgus, right.  The various methods of treatment have been discussed with the patient.  No guarantees were given.  After consideration of risks, benefits and other options for treatment, the patient has consented to surgery.  I have reviewed the patients' chart and labs.    PROCEDURE: RIGHT FOOT RESECTION OF GOUTY TOPHI RIGHT FOOT AUSTIN BUNIONECTOMY   A history and physical examination was performed in my office.  The patient was reexamined.  There have been no changes to this history and physical examination.  Caroline More, DPM

## 2022-10-13 NOTE — Transfer of Care (Signed)
Immediate Anesthesia Transfer of Care Note  Patient: Joel Galvan  Procedure(s) Performed: Erlinda Hong (Right: Toe)  Patient Location: PACU  Anesthesia Type: Joel  Level of Consciousness: awake, alert  and patient cooperative  Airway and Oxygen Therapy: Patient Spontanous Breathing and Patient connected to supplemental oxygen  Post-op Assessment: Post-op Vital signs reviewed, Patient's Cardiovascular Status Stable, Respiratory Function Stable, Patent Airway and No signs of Nausea or vomiting  Post-op Vital Signs: Reviewed and stable  Complications: No notable events documented.

## 2022-10-13 NOTE — Anesthesia Postprocedure Evaluation (Signed)
Anesthesia Post Note  Patient: Joel Galvan  Procedure(s) Performed: Erlinda Hong (Right: Toe)  Patient location during evaluation: PACU Anesthesia Type: General Level of consciousness: awake and alert Pain management: pain level controlled Vital Signs Assessment: post-procedure vital signs reviewed and stable Respiratory status: spontaneous breathing, nonlabored ventilation, respiratory function stable and patient connected to nasal cannula oxygen Cardiovascular status: blood pressure returned to baseline and stable Postop Assessment: no apparent nausea or vomiting Anesthetic complications: no   No notable events documented.   Last Vitals:  Vitals:   10/13/22 1133 10/13/22 1145  BP: 104/70 113/68  Pulse: 63 (!) 59  Resp: 14 12  Temp: (!) 36.2 C (!) 36.2 C  SpO2: 100% 95%    Last Pain:  Vitals:   10/13/22 1145  TempSrc:   PainSc: 0-No pain                 Ilene Qua

## 2022-10-13 NOTE — Op Note (Signed)
PODIATRY / FOOT AND ANKLE SURGERY OPERATIVE REPORT    SURGEON: Caroline More, DPM  PRE-OPERATIVE DIAGNOSIS:  1.  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  Right hallux valgus 3.  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  Large gouty tophus present at the medial aspect of the first metatarsal phalangeal joint right 2.  Arthritic changes to the first metatarsal phalangeal joint consistent with early stage osteoarthritis with medial cartilage damage  PATHOLOGY/SPECIMEN(S): Gouty tophi first metatarsal phalange joint right foot  INDICATIONS:   KIPPER BUCH is a 77 y.o. male who presents with a painful bunion deformity to the first metatarsal phalangeal joint.  Patient does have a history of gout and believes that gout flareups do occur to this area from time to time.  X-ray imaging was taken which showed a mild bunion deformity but also increased medial eminence and calcifications within the soft tissue of the medial eminence consistent with gouty tophus.  Patient has exhausted conservative measures and would like to perform surgical intervention today consisting of resection of gouty tophi with Alt[place]e [place].  All treatment options were discussed with the patient both conservative and surgical attempts at correction clean potential risks and complications of surgical intervention.  At this time patient is elected for surgical intervention consisting of right first metatarsal phalangeal joint gouty tophus resection with Liane Comber bunionectomy.  No guarantees given.  Discussed with patient that he may need first metatarsal phalangeal joint in the future.  Consent obtained prior to procedure.  DESCRIPTION: After  obtaining full informed written consent, the patient was brought back to the operating room and placed supine upon the operating table.  The patient received IV antibiotics prior to induction.  After obtaining adequate anesthesia, 20 cc of half percent Marcaine plain was injected about the first ray and first metatarsal phalangeal joint area.  The patient was prepped and draped in the standard fashion.  An Esmarch bandage was used to exsanguinate the right lower extremity and pneumatic ankle tourniquet was inflated.  Attention was directed to the right first metatarsal phalangeal joint area where a linear longitudinal incision was made medial to the tendon of the extensor hallucis longus involve the contour of the deformity.  The incision was deepened to the subcutaneous tissues utilizing sharp and blunt dissection, care was taken to identify and retract all vital neurovascular structures and all venous contributories were cauterized as necessary.  At this time a capsular and periosteal incision was made medial to the tendon of the extensor hallucis longus once again involve the contour deformity.  The capsular and periosteal tissue was reflected medially and laterally thereby exposing the first metatarsal phalangeal joint at the operative site.  There appeared to be a large gouty tophus contained within the medial capsular tissue.  The capsular tissue was dissected free from the large gouty tophus and gouty tophi was resected and passed off the operative site and sent off to pathology.  Rongeur was also used to remove any further gouty tophi remaining to the area until the area appeared to be fairly clean overall.  The first metatarsal head was evaluated and appeared to have fairly substantial cartilage damage at the medial aspect but centrally, dorsally, and laterally and all appear to be intact.  The base the proximal  phalanx also appeared to have gouty crystals embedded into the articular cartilage.  There  also appeared to be a large dorsal exostosis present.  At this time sagittal bone saw was used to resect the medial eminence as well as the dorsal prominence, performing cheilectomy to the area.  The rongeur was used to remove some of the exostosis present over the dorsal aspect of the proximal phalanx of the hallux.  The joint was flushed with copious amounts normal sterile saline.  There appeared to be a 1 cm by half centimeter area at the medial plantar aspect of the head of the first metatarsal which appeared to have articular cartilage damage.  A 0.045 K wire was then used to fenestrate this area to try to promote fibrocartilage ingrowth.  Attention was then directed to the first interspace via the same incision where the extensor tendon was retracted medially and the skin and subcutaneous tissue was retracted laterally.  At this time a lateral release was performed releasing the collateral and suspensory ligament as well as the conjoined tendon of the adductor hallucis.  At this time attention was then directed to the medial aspect of the first metatarsal head wearing Austin osteotomy was performed from medial to lateral.  Once the osteotomy was completed the capital fragment was shifted approximately 4 mm laterally.  With the fragment held in place temporary fixation was obtained across the osteotomy site.  C-arm imaging was utilized to verify correct position which appeared to be excellent as the hallux appeared to be in a rectus position overall with reduction in the first intermetatarsal space angle and smooth contour at the medial aspect of the foot.  A wire was then placed from the dorsal distal first metatarsal shaft going from dorsal to plantar across the osteotomy site and into the capital fragment of the first metatarsal head.  This was done under fluoroscopic guidance.  The wire appeared to be in excellent position overall.  At this time utilizing standard AO principles and techniques a 2.5 x 30m  partially-threaded headed Paragon 28 screw was placed across the osteotomy site with excellent compression.  C-arm imaging was utilized to verify length of screw and correct position which appeared to be excellent.  The medial eminence/overhang was resected and passed off the operative site.  The surgical site was flushed with copious amounts normal sterile saline.  The periosteal and capsular structures then reapproximated well coapted with 3-0 Vicryl.  The subcutaneous tissue was reapproximated well coapted with 4-0 Vicryl and the skin was then reapproximated well coapted with 4-0 nylon in horizontal mattress type stitching.  An additional 10 cc of Exparel was injected about the operative area.  A postoperative dressing was applied consisting of Xeroform to the incision line followed by 4 x 4 gauze, Kling, Kerlix, Ace wrap, cam boot.  The pneumatic ankle tourniquet was deflated and a prompt hyperemic response was noted all digits of the right foot prior to placing the postoperative dressing.  The patient tolerated the procedure and anesthesia well was transferred to recovery room vital signs stable vascular status intact all toes of the right foot.  Following.  Postoperative monitoring the patient be discharged home with the appropriate orders, instructions, and medications.  Patient is to remain partial weightbearing with heel contact in cam boot with use of crutches or walker.  Patient to follow-up in clinic in 1 week for further evaluation.  COMPLICATIONS: None  CONDITION: Good, stable  ACaroline More DPM

## 2022-10-14 LAB — SURGICAL PATHOLOGY

## 2023-02-16 ENCOUNTER — Ambulatory Visit: Payer: Medicare PPO | Admitting: Cardiovascular Disease
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
## 2023-02-17 NOTE — Progress Notes (Signed)
This encounter was created in error - please disregard.

## 2023-02-22 ENCOUNTER — Ambulatory Visit: Payer: Medicare PPO | Admitting: Cardiovascular Disease

## 2023-03-10 ENCOUNTER — Encounter: Payer: Self-pay | Admitting: Cardiovascular Disease

## 2023-03-10 ENCOUNTER — Ambulatory Visit: Payer: Medicare PPO | Attending: Cardiovascular Disease | Admitting: Cardiovascular Disease

## 2023-03-10 VITALS — BP 118/78 | HR 62 | Ht 68.0 in | Wt 186.5 lb

## 2023-03-10 DIAGNOSIS — I951 Orthostatic hypotension: Secondary | ICD-10-CM | POA: Diagnosis not present

## 2023-03-10 DIAGNOSIS — I2510 Atherosclerotic heart disease of native coronary artery without angina pectoris: Secondary | ICD-10-CM

## 2023-03-10 DIAGNOSIS — E785 Hyperlipidemia, unspecified: Secondary | ICD-10-CM
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
## 2023-03-10 NOTE — Patient Instructions (Signed)
Medication Instructions:  No changes *If you need a refill on your cardiac medications before your next appointment, please call your pharmacy*   Lab Work: None ordered If you have labs (blood work) drawn today and your tests are completely normal, you will receive your results only by: MyChart Message (if you have MyChart) OR A paper copy in the mail If you have any lab test that is abnormal or we need to change your treatment, we will call you to review the results.   Testing/Procedures: None ordered   Follow-Up: At [facility], you and your health needs are our priority.  As part of our continuing mission to provide you with exceptional heart care, we have created designated Provider Care Teams.  These Care Teams include your primary Cardiologist (physician) and Advanced Practice Providers (APPs -  Physician Assistants and Nurse Practitioners) who all work together to provide you with the care you need, when you need it.  We recommend signing up for the patient portal called "MyChart".  Sign up information is provided on this After Visit Summary.  MyChart is used to connect with patients for Virtual Visits (Telemedicine).  Patients are able to view lab/test results, encounter notes, upcoming appointments, etc.  Non-urgent messages can be sent to your provider as well.   To learn more about what you can do with MyChart, go to [email or web address].    Your next appointment:   12 month(s)  Provider:   You may see Muhammad Arida, MD or one of the following Advanced Practice Providers on your designated Care Team:   Christopher Berge, NP Ryan Dunn, PA-C Cadence Furth, PA-C Sheri Hammock, NP    

## 2023-08-14 ENCOUNTER — Encounter: Payer: Self-pay | Admitting: Cardiovascular Disease

## 2023-08-21 ENCOUNTER — Other Ambulatory Visit: Payer: Self-pay | Admitting: Internal Medicine

## 2023-08-21 DIAGNOSIS — R079 Chest pain, unspecified: Secondary | ICD-10-CM

## 2023-09-04 ENCOUNTER — Encounter (HOSPITAL_COMMUNITY): Payer: Self-pay

## 2023-09-06 ENCOUNTER — Ambulatory Visit
Admission: RE | Admit: 2023-09-06 | Discharge: 2023-09-06 | Disposition: A | Discharge status: Home / Self Care | Payer: Medicare PPO | Source: Ambulatory Visit | Attending: Internal Medicine | Admitting: Internal Medicine

## 2023-09-06 DIAGNOSIS — I2510 Atherosclerotic heart disease of native coronary artery without angina pectoris: Secondary | ICD-10-CM | POA: Insufficient documentation

## 2023-09-06 DIAGNOSIS — R079 Chest pain, unspecified: Secondary | ICD-10-CM | POA: Insufficient documentation

## 2023-09-06 MED ORDER — NITROGLYCERIN 0.4 MG SL SUBL
0.8000 mg | SUBLINGUAL_TABLET | SUBLINGUAL | Status: DC | PRN
  Administered 2023-09-06: 0.8 mg via SUBLINGUAL
  Filled 2023-09-06: qty 25

## 2023-09-06 MED ORDER — IOHEXOL 350 MG/ML SOLN
80.0000 mL | Freq: Once | INTRAVENOUS | Status: AC | PRN
  Administered 2023-09-06: 80 mL via INTRAVENOUS

## 2023-09-06 NOTE — Progress Notes (Signed)

## 2023-09-06 NOTE — Addendum Note (Signed)
Encounter addended by: Ilean China, RT on: 09/06/2023 10:26 AM  Actions taken: Imaging Exam ended

## 2024-02-15 ENCOUNTER — Ambulatory Visit
Admission: RE | Admit: 2024-02-15 | Discharge: 2024-02-15 | Disposition: A | Discharge status: Home / Self Care | Source: Ambulatory Visit | Attending: Internal Medicine | Admitting: Internal Medicine

## 2024-02-15 ENCOUNTER — Other Ambulatory Visit: Payer: Self-pay | Admitting: Internal Medicine

## 2024-02-15 DIAGNOSIS — R590 Localized enlarged lymph nodes: Secondary | ICD-10-CM | POA: Diagnosis present

## 2024-02-15 DIAGNOSIS — R0781 Pleurodynia: Secondary | ICD-10-CM

## 2024-02-15 MED ORDER — IOHEXOL 300 MG/ML  SOLN
75.0000 mL | Freq: Once | INTRAMUSCULAR | Status: AC | PRN
  Administered 2024-02-15: 75 mL via INTRAVENOUS

## 2024-04-09 ENCOUNTER — Encounter: Payer: Self-pay | Admitting: Cardiovascular Disease

## 2024-04-09 ENCOUNTER — Ambulatory Visit: Attending: Cardiovascular Disease | Admitting: Cardiovascular Disease

## 2024-04-09 VITALS — BP 100/64 | HR 66 | Ht 68.0 in | Wt 186.5 lb

## 2024-04-09 DIAGNOSIS — I2510 Atherosclerotic heart disease of native coronary artery without angina pectoris: Secondary | ICD-10-CM

## 2024-04-09 DIAGNOSIS — I951 Orthostatic hypotension: Secondary | ICD-10-CM

## 2024-04-09 DIAGNOSIS — E785 Hyperlipidemia, unspecified: Secondary | ICD-10-CM | POA: Diagnosis not present

## 2024-04-09 NOTE — Patient Instructions (Signed)
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
## 2024-07-04 ENCOUNTER — Encounter: Payer: Self-pay | Admitting: Cardiovascular Disease
